# Patient Record
Sex: Female | Born: 1980 | Race: Black or African American | Hispanic: No | Marital: Single | Smoking: Never smoker
Health system: Southern US, Community
[De-identification: ages and names within clinical notes are randomized; demographics above are authoritative.]

## PROBLEM LIST (undated history)

## (undated) DIAGNOSIS — D219 Benign neoplasm of connective and other soft tissue, unspecified: Secondary | ICD-10-CM

## (undated) DIAGNOSIS — L509 Urticaria, unspecified: Secondary | ICD-10-CM

## (undated) DIAGNOSIS — D649 Anemia, unspecified: Secondary | ICD-10-CM

## (undated) HISTORY — PX: ROBOT ASSISTED MYOMECTOMY: SHX5142

## (undated) HISTORY — DX: Urticaria, unspecified: L50.9

---

## 2005-02-24 ENCOUNTER — Ambulatory Visit: Payer: Self-pay | Admitting: Family Medicine

## 2005-02-24 ENCOUNTER — Encounter: Payer: Self-pay | Admitting: Family Medicine

## 2005-05-21 ENCOUNTER — Emergency Department (HOSPITAL_COMMUNITY): Admission: EM | Admit: 2005-05-21 | Discharge: 2005-05-21 | Payer: Self-pay | Admitting: Family Medicine

## 2006-03-07 ENCOUNTER — Ambulatory Visit: Payer: Self-pay | Admitting: *Deleted

## 2006-03-07 ENCOUNTER — Encounter (INDEPENDENT_AMBULATORY_CARE_PROVIDER_SITE_OTHER): Payer: Self-pay | Admitting: *Deleted

## 2006-03-21 ENCOUNTER — Ambulatory Visit: Payer: Self-pay | Admitting: Obstetrics & Gynecology

## 2007-07-04 HISTORY — PX: ROBOT ASSISTED MYOMECTOMY: SHX5142

## 2007-12-12 ENCOUNTER — Emergency Department (HOSPITAL_BASED_OUTPATIENT_CLINIC_OR_DEPARTMENT_OTHER): Admission: EM | Admit: 2007-12-12 | Discharge: 2007-12-12 | Payer: Self-pay | Admitting: Emergency Medicine

## 2007-12-13 ENCOUNTER — Emergency Department (HOSPITAL_BASED_OUTPATIENT_CLINIC_OR_DEPARTMENT_OTHER): Admission: EM | Admit: 2007-12-13 | Discharge: 2007-12-13 | Payer: Self-pay | Admitting: Emergency Medicine

## 2007-12-23 ENCOUNTER — Other Ambulatory Visit: Admission: RE | Admit: 2007-12-23 | Discharge: 2007-12-23 | Payer: Self-pay | Admitting: Family Medicine

## 2007-12-26 ENCOUNTER — Encounter: Admission: RE | Admit: 2007-12-26 | Discharge: 2007-12-26 | Payer: Self-pay | Admitting: Family Medicine

## 2009-01-05 ENCOUNTER — Other Ambulatory Visit: Admission: RE | Admit: 2009-01-05 | Discharge: 2009-01-05 | Payer: Self-pay | Admitting: Obstetrics and Gynecology

## 2010-01-10 ENCOUNTER — Other Ambulatory Visit: Admission: RE | Admit: 2010-01-10 | Discharge: 2010-01-10 | Payer: Self-pay | Admitting: Obstetrics and Gynecology

## 2010-11-18 NOTE — Group Therapy Note (Signed)
[name], [name]               ACCOUNT NO.:  [id number]   MEDICAL RECORD NO.:  [id number]          PATIENT TYPE:  WOC   LOCATION:  [facility]                   FACILITY:  [facility]   PHYSICIAN:  Tinnie Gens, MD        DATE OF BIRTH:  [birth year]   DATE OF SERVICE:  02/24/2005                                    CLINIC NOTE   CHIEF COMPLAINT:  Pap smear.   HISTORY OF PRESENT ILLNESS:  The patient is a 30 year old G0 who comes in  today for annual exam. She currently uses condoms for birth control. She has  regular cycles that approximately 7 days with heavy flow, moderate to severe  pain related to her pounds. LMP is February 12, 2005. She has never been on  any birth control.   PAST MEDICAL HISTORY:  Negative.   PAST SURGICAL HISTORY:  Negative.   MEDICATIONS:  None.   ALLERGIES:  None known.   OBSTETRICAL HISTORY:  G0.   GYNECOLOGICAL HISTORY:  History of abnormal Pap in February 2004. She had a  repeat Pap in August 2004 and no further follow-up since that time.   FAMILY HISTORY:  High blood pressure.   SOCIAL HISTORY:  The patient works at [employer]. She does use  tobacco, alcohol or drugs.   REVIEW OF SYSTEMS:  A 14-point review of systems reviewed, please see GYN  history in the chart. She does have headaches as well as vaginal odor that  she was having over some months last year after her cycle was complete. She  denies douching or sit-down baths with an frequent or regularity.   PHYSICAL EXAMINATION:  VITAL SIGNS:  As noted in the chart.  GENERAL:  She is a well-developed, well-nourished black female in no acute  distress.  BREASTS:  Symmetric with everted nipples.  NECK:  Supple with normal thyroid.  ABDOMEN:  Soft, nontender, nondistended.  GENITOURINARY:  Normal external female genitalia. The vagina is pink and  rugated. The C-section is nulliparous and without lesion. The uterus is less  than 6 weeks size, anteverted, nontender. There is no adnexal mass  or  tenderness.  EXTREMITIES:  No cyanosis, clubbing, or edema.   IMPRESSION:  1.  Gynecologic examination with Pap and pelvic.  2.  Contraceptive counseling.   PLAN:  1.  Pap smear today.  2.  GC and chlamydia check as the patient is less than 25.  3.  Fifteen minutes were spent with this patient discussing different forms      of birth control including the patch, the ring, the pill, Depo-Provera,      IUD. The patient agrees to a trial of low-dose oral contraceptives.      However, it is not clear from our discussion whether she actually will      start this or not. The pros and cons of this method were discussed      including predictability of cycle, decreased length of flow, and      decreased pain with periods.  4.  Discussion was also held with the patient regarding good vaginal  health  and the patient seemed to understand all this and will return with      further symptoms.           ______________________________  Tinnie Gens, MD     TP/MEDQ  D:  02/24/2005  T:  02/24/2005  Job:  [id number]

## 2010-11-18 NOTE — Group Therapy Note (Signed)
NAMEJAMIRAH, [name]               ACCOUNT NO.:  [id number]   MEDICAL RECORD NO.:  [id number]          PATIENT TYPE:  WOC   LOCATION:  [facility]                   FACILITY:  [facility]   PHYSICIAN:  Carolanne Grumbling, M.D.   DATE OF BIRTH:  [birth year]   DATE OF SERVICE:                                    CLINIC NOTE   CHIEF COMPLAINT:  Pap smear.   HISTORY OF PRESENT ILLNESS:  A 30 year old G0 here for annual exam.  The  patient seen last year for same thing.  Prescribed birth control pills and  reports that she lost her hair from them.  Her hair started growing back  after she stopped the pills.  The patient is still sexually active and is  not consistently using condoms.  She does not desire pregnancy at this  point.   PAST MEDICAL HISTORY:  All reviewed and no changes from office visit on  February 24, 2005.   PAST SURGICAL HISTORY:  All reviewed and no changes from office visit on  February 24, 2005.   MEDICATIONS:  All reviewed and no changes from office visit on February 24, 2005.   ALLERGIES:   OBSTETRICAL HISTORY:  All reviewed and no changes from office visit on  February 24, 2005.   GYNECOLOGICAL HISTORY:  All reviewed and no changes from office visit on  February 24, 2005.   FAMILY HISTORY:  All reviewed and no changes from office visit on February 24, 2005.   SOCIAL HISTORY:  All reviewed and no changes from office visit on February 24, 2005.   PHYSICAL EXAMINATION:  VITAL SIGNS:  Temperature 99.1, blood pressure  107/60, pulse 69, weight 131.8 pounds, height 5 feet 5 inches.  GENERAL:  Well-developed, well-nourished, African American female in no  apparent distress.  HEENT:  Normocephalic, atraumatic.  NECK:  Supple, no thyromegaly.  CARDIOVASCULAR:  Regular rate and rhythm.  No heaves.  LUNGS:  Clear to auscultation bilaterally.  BREASTS:  Symmetric with no masses.  ABDOMEN:  Soft and nontender.  GENITOURINARY:  Normal external female genitalia with pink rugae.  Cervix  is  nulliparous without lesions.  Uterus is anteverted and nontender.  No  adnexal masses.  EXTREMITIES:  No cyanosis, edema.   IMPRESSION:  1. Well woman exam with Papanicolaou smear and pelvic.  2. __________.  3. Contraceptive counseling.   PLAN:  1. Pap smear today.  2. Gonorrhea, chlamydia, wet prep, RPR, and HIV all done today.  3. Time was spent counseling the patient on different methods of birth      control including IUD and family planning. She is to obtain information      about IUD and is considering that.  She is also being counseled on how      to find out if there is a natural family planning counselor available.      I stressed the importance that she use some method otherwise pregnancy      is a definite possibility.  I counseled her to take a multivitamin with      folic acid daily.  4. The  patient is to return in two weeks to discuss results of HIV test.      I explained that I do not give these results over the phone.           ______________________________  Carolanne Grumbling, M.D.     TW/MEDQ  D:  03/07/2006  T:  03/08/2006  Job:  [id number]

## 2011-01-12 ENCOUNTER — Other Ambulatory Visit (HOSPITAL_COMMUNITY)
Admission: RE | Admit: 2011-01-12 | Discharge: 2011-01-12 | Disposition: A | Discharge status: Home / Self Care | Payer: 59 | Source: Ambulatory Visit | Attending: Obstetrics and Gynecology | Admitting: Obstetrics and Gynecology

## 2011-01-12 ENCOUNTER — Other Ambulatory Visit: Payer: Self-pay | Admitting: Obstetrics and Gynecology

## 2011-01-12 DIAGNOSIS — Z01419 Encounter for gynecological examination (general) (routine) without abnormal findings: Secondary | ICD-10-CM | POA: Insufficient documentation

## 2011-01-12 DIAGNOSIS — Z113 Encounter for screening for infections with a predominantly sexual mode of transmission: Secondary | ICD-10-CM | POA: Insufficient documentation

## 2011-02-01 ENCOUNTER — Other Ambulatory Visit: Payer: Self-pay | Admitting: Family Medicine

## 2011-02-01 DIAGNOSIS — E041 Nontoxic single thyroid nodule: Secondary | ICD-10-CM

## 2011-02-03 ENCOUNTER — Ambulatory Visit
Admission: RE | Admit: 2011-02-03 | Discharge: 2011-02-03 | Disposition: A | Discharge status: Home / Self Care | Payer: 59 | Source: Ambulatory Visit | Attending: Family Medicine | Admitting: Family Medicine

## 2011-02-03 DIAGNOSIS — E041 Nontoxic single thyroid nodule: Secondary | ICD-10-CM

## 2012-01-26 ENCOUNTER — Other Ambulatory Visit: Payer: Self-pay | Admitting: Family Medicine

## 2012-01-26 DIAGNOSIS — E041 Nontoxic single thyroid nodule: Secondary | ICD-10-CM

## 2012-01-31 ENCOUNTER — Ambulatory Visit
Admission: RE | Admit: 2012-01-31 | Discharge: 2012-01-31 | Disposition: A | Discharge status: Home / Self Care | Payer: 59 | Source: Ambulatory Visit | Attending: Family Medicine | Admitting: Family Medicine

## 2012-01-31 DIAGNOSIS — E041 Nontoxic single thyroid nodule: Secondary | ICD-10-CM

## 2012-02-06 ENCOUNTER — Other Ambulatory Visit: Payer: Self-pay | Admitting: Obstetrics and Gynecology

## 2012-02-06 ENCOUNTER — Other Ambulatory Visit (HOSPITAL_COMMUNITY)
Admission: RE | Admit: 2012-02-06 | Discharge: 2012-02-06 | Disposition: A | Discharge status: Home / Self Care | Payer: 59 | Source: Ambulatory Visit | Attending: Obstetrics and Gynecology | Admitting: Obstetrics and Gynecology

## 2012-02-06 DIAGNOSIS — Z01419 Encounter for gynecological examination (general) (routine) without abnormal findings: Secondary | ICD-10-CM | POA: Insufficient documentation

## 2012-02-06 DIAGNOSIS — R87810 Cervical high risk human papillomavirus (HPV) DNA test positive: Secondary | ICD-10-CM | POA: Insufficient documentation

## 2012-02-06 DIAGNOSIS — Z113 Encounter for screening for infections with a predominantly sexual mode of transmission: Secondary | ICD-10-CM | POA: Insufficient documentation

## 2013-02-19 ENCOUNTER — Other Ambulatory Visit: Payer: Self-pay | Admitting: Obstetrics and Gynecology

## 2013-02-19 ENCOUNTER — Other Ambulatory Visit (HOSPITAL_COMMUNITY)
Admission: RE | Admit: 2013-02-19 | Discharge: 2013-02-19 | Disposition: A | Discharge status: Home / Self Care | Payer: 59 | Source: Ambulatory Visit | Attending: Obstetrics and Gynecology | Admitting: Obstetrics and Gynecology

## 2013-02-19 DIAGNOSIS — Z01419 Encounter for gynecological examination (general) (routine) without abnormal findings: Secondary | ICD-10-CM | POA: Insufficient documentation

## 2014-02-06 ENCOUNTER — Encounter (HOSPITAL_COMMUNITY): Payer: Self-pay | Admitting: Pharmacist

## 2014-02-17 ENCOUNTER — Encounter (HOSPITAL_COMMUNITY): Payer: Self-pay

## 2014-02-17 ENCOUNTER — Encounter (HOSPITAL_COMMUNITY)
Admission: RE | Admit: 2014-02-17 | Discharge: 2014-02-17 | Disposition: A | Discharge status: Home / Self Care | Payer: 59 | Source: Ambulatory Visit | Attending: Obstetrics and Gynecology | Admitting: Obstetrics and Gynecology

## 2014-02-17 DIAGNOSIS — N938 Other specified abnormal uterine and vaginal bleeding: Secondary | ICD-10-CM | POA: Diagnosis present

## 2014-02-17 DIAGNOSIS — N83209 Unspecified ovarian cyst, unspecified side: Secondary | ICD-10-CM | POA: Diagnosis not present

## 2014-02-17 DIAGNOSIS — N925 Other specified irregular menstruation: Secondary | ICD-10-CM | POA: Diagnosis not present

## 2014-02-17 DIAGNOSIS — D250 Submucous leiomyoma of uterus: Secondary | ICD-10-CM | POA: Diagnosis not present

## 2014-02-17 DIAGNOSIS — N949 Unspecified condition associated with female genital organs and menstrual cycle: Secondary | ICD-10-CM | POA: Diagnosis present

## 2014-02-17 DIAGNOSIS — N803 Endometriosis of pelvic peritoneum, unspecified: Secondary | ICD-10-CM | POA: Diagnosis not present

## 2014-02-17 DIAGNOSIS — D649 Anemia, unspecified: Secondary | ICD-10-CM | POA: Diagnosis not present

## 2014-02-17 DIAGNOSIS — N920 Excessive and frequent menstruation with regular cycle: Secondary | ICD-10-CM | POA: Diagnosis not present

## 2014-02-17 DIAGNOSIS — N80109 Endometriosis of ovary, unspecified side, unspecified depth: Secondary | ICD-10-CM | POA: Diagnosis not present

## 2014-02-17 DIAGNOSIS — N801 Endometriosis of ovary: Secondary | ICD-10-CM | POA: Diagnosis not present

## 2014-02-17 HISTORY — DX: Anemia, unspecified: D64.9

## 2014-02-17 LAB — CBC
HCT: 39.7 % (ref 36.0–46.0)
Hemoglobin: 13.3 g/dL (ref 12.0–15.0)
MCH: 29 pg (ref 26.0–34.0)
MCHC: 33.5 g/dL (ref 30.0–36.0)
MCV: 86.7 fL (ref 78.0–100.0)
Platelets: 305 10*3/uL (ref 150–400)
RBC: 4.58 MIL/uL (ref 3.87–5.11)
RDW: 19.2 % — ABNORMAL HIGH (ref 11.5–15.5)
WBC: 5.2 10*3/uL (ref 4.0–10.5)

## 2014-02-17 NOTE — Patient Instructions (Signed)
Your procedure is scheduled on:02/20/14  Enter through the Main Entrance at : 9am Pick up desk phone and dial [phone] and inform us of your arrival.  Please call [phone] if you have any problems the morning of surgery.  Remember: Do not eat food or drink liquids, including water, after midnight:Thursday    You may brush your teeth the morning of surgery.  Take these meds the morning of surgery with a sip of water:  DO NOT wear jewelry, eye make-up, lipstick,body lotion, or dark fingernail polish.  (Polished toes are ok) You may wear deodorant.  If you are to be admitted after surgery, leave suitcase in car until your room has been assigned. Patients discharged on the day of surgery will not be allowed to drive home. Wear loose fitting, comfortable clothes for your ride home.

## 2014-02-20 ENCOUNTER — Encounter (HOSPITAL_COMMUNITY): Payer: 59 | Admitting: Anesthesiology

## 2014-02-20 ENCOUNTER — Encounter (HOSPITAL_COMMUNITY)
Admission: RE | Disposition: A | Discharge status: Home / Self Care | Payer: Self-pay | Source: Ambulatory Visit | Attending: Obstetrics and Gynecology

## 2014-02-20 ENCOUNTER — Ambulatory Visit (HOSPITAL_COMMUNITY)
Admission: RE | Admit: 2014-02-20 | Discharge: 2014-02-20 | Disposition: A | Discharge status: Home / Self Care | Payer: 59 | Source: Ambulatory Visit | Attending: Obstetrics and Gynecology | Admitting: Obstetrics and Gynecology

## 2014-02-20 ENCOUNTER — Encounter (HOSPITAL_COMMUNITY): Payer: Self-pay

## 2014-02-20 ENCOUNTER — Ambulatory Visit (HOSPITAL_COMMUNITY): Payer: 59 | Admitting: Anesthesiology

## 2014-02-20 DIAGNOSIS — N801 Endometriosis of ovary: Secondary | ICD-10-CM | POA: Insufficient documentation

## 2014-02-20 DIAGNOSIS — N949 Unspecified condition associated with female genital organs and menstrual cycle: Secondary | ICD-10-CM | POA: Diagnosis not present

## 2014-02-20 DIAGNOSIS — N803 Endometriosis of pelvic peritoneum, unspecified: Secondary | ICD-10-CM | POA: Insufficient documentation

## 2014-02-20 DIAGNOSIS — N920 Excessive and frequent menstruation with regular cycle: Secondary | ICD-10-CM | POA: Insufficient documentation

## 2014-02-20 DIAGNOSIS — N938 Other specified abnormal uterine and vaginal bleeding: Secondary | ICD-10-CM | POA: Diagnosis not present

## 2014-02-20 DIAGNOSIS — N925 Other specified irregular menstruation: Secondary | ICD-10-CM | POA: Insufficient documentation

## 2014-02-20 DIAGNOSIS — N80109 Endometriosis of ovary, unspecified side, unspecified depth: Secondary | ICD-10-CM | POA: Insufficient documentation

## 2014-02-20 DIAGNOSIS — D250 Submucous leiomyoma of uterus: Secondary | ICD-10-CM | POA: Insufficient documentation

## 2014-02-20 DIAGNOSIS — N83209 Unspecified ovarian cyst, unspecified side: Secondary | ICD-10-CM | POA: Insufficient documentation

## 2014-02-20 DIAGNOSIS — D649 Anemia, unspecified: Secondary | ICD-10-CM | POA: Insufficient documentation

## 2014-02-20 HISTORY — PX: ROBOTIC ASSISTED LAPAROSCOPIC LYSIS OF ADHESION: SHX6080

## 2014-02-20 HISTORY — PX: ROBOT ASSISTED MYOMECTOMY: SHX5142

## 2014-02-20 HISTORY — PX: ROBOTIC ASSISTED LAPAROSCOPIC OVARIAN CYSTECTOMY: SHX6081

## 2014-02-20 LAB — TYPE AND SCREEN
ABO/RH(D): O POS
Antibody Screen: NEGATIVE

## 2014-02-20 LAB — PREGNANCY, URINE: Preg Test, Ur: NEGATIVE

## 2014-02-20 LAB — ABO/RH: ABO/RH(D): O POS

## 2014-02-20 SURGERY — ROBOTIC ASSISTED MYOMECTOMY
Anesthesia: General | Site: Abdomen | Laterality: Right

## 2014-02-20 MED ORDER — DEXAMETHASONE SODIUM PHOSPHATE 10 MG/ML IJ SOLN
INTRAMUSCULAR | Status: DC | PRN
  Administered 2014-02-20: 4 mg via INTRAVENOUS

## 2014-02-20 MED ORDER — PROPOFOL 10 MG/ML IV EMUL
INTRAVENOUS | Status: AC
  Filled 2014-02-20: qty 20

## 2014-02-20 MED ORDER — ACETAMINOPHEN 160 MG/5ML PO SOLN
ORAL | Status: AC
  Filled 2014-02-20: qty 40.6

## 2014-02-20 MED ORDER — CEFAZOLIN SODIUM-DEXTROSE 2-3 GM-% IV SOLR
2.0000 g | INTRAVENOUS | Status: AC
  Administered 2014-02-20: 2 g via INTRAVENOUS

## 2014-02-20 MED ORDER — PHENYLEPHRINE HCL 10 MG/ML IJ SOLN
INTRAMUSCULAR | Status: DC | PRN
  Administered 2014-02-20: 80 ug via INTRAVENOUS

## 2014-02-20 MED ORDER — VASOPRESSIN 20 UNIT/ML IJ SOLN
INTRAMUSCULAR | Status: AC
  Filled 2014-02-20: qty 1

## 2014-02-20 MED ORDER — KETOROLAC TROMETHAMINE 30 MG/ML IJ SOLN
INTRAMUSCULAR | Status: DC | PRN
  Administered 2014-02-20: 30 mg via INTRAVENOUS

## 2014-02-20 MED ORDER — HEPARIN SODIUM (PORCINE) 5000 UNIT/ML IJ SOLN
INTRAMUSCULAR | Status: AC
  Filled 2014-02-20: qty 2

## 2014-02-20 MED ORDER — ONDANSETRON HCL 4 MG/2ML IJ SOLN
INTRAMUSCULAR | Status: DC | PRN
  Administered 2014-02-20: 4 mg via INTRAVENOUS

## 2014-02-20 MED ORDER — MIDAZOLAM HCL 2 MG/2ML IJ SOLN
INTRAMUSCULAR | Status: AC
  Filled 2014-02-20: qty 2

## 2014-02-20 MED ORDER — HEPARIN SODIUM (PORCINE) 5000 UNIT/ML IJ SOLN
INTRAMUSCULAR | Status: DC | PRN
  Administered 2014-02-20: 5000 [IU]
  Administered 2014-02-20: 15000 [IU]

## 2014-02-20 MED ORDER — INDIGOTINDISULFONATE SODIUM 8 MG/ML IJ SOLN: INTRAMUSCULAR | Status: DC | PRN

## 2014-02-20 MED ORDER — LIDOCAINE HCL (CARDIAC) 20 MG/ML IV SOLN
INTRAVENOUS | Status: AC
  Filled 2014-02-20: qty 5

## 2014-02-20 MED ORDER — GLYCOPYRROLATE 0.2 MG/ML IJ SOLN
INTRAMUSCULAR | Status: DC | PRN
  Administered 2014-02-20: 0.4 mg via INTRAVENOUS

## 2014-02-20 MED ORDER — METHYLENE BLUE 1 % INJ SOLN
INTRAMUSCULAR | Status: DC | PRN
  Administered 2014-02-20: 50 mL

## 2014-02-20 MED ORDER — ONDANSETRON HCL 4 MG PO TABS: 4.0000 mg | ORAL_TABLET | Freq: Three times a day (TID) | ORAL | Status: DC | PRN

## 2014-02-20 MED ORDER — FENTANYL CITRATE 0.05 MG/ML IJ SOLN
INTRAMUSCULAR | Status: DC | PRN
Start: 2014-02-20 — End: 2014-02-20
  Administered 2014-02-20: 100 ug via INTRAVENOUS
  Administered 2014-02-20: 50 ug via INTRAVENOUS
  Administered 2014-02-20 (×2): 100 ug via INTRAVENOUS

## 2014-02-20 MED ORDER — ONDANSETRON HCL 4 MG/2ML IJ SOLN
INTRAMUSCULAR | Status: AC
  Filled 2014-02-20: qty 2

## 2014-02-20 MED ORDER — DEXAMETHASONE SODIUM PHOSPHATE 10 MG/ML IJ SOLN
INTRAMUSCULAR | Status: AC
  Filled 2014-02-20: qty 1

## 2014-02-20 MED ORDER — SCOPOLAMINE 1 MG/3DAYS TD PT72
MEDICATED_PATCH | TRANSDERMAL | Status: AC
  Administered 2014-02-20: 1.5 mg via TRANSDERMAL
  Filled 2014-02-20: qty 1

## 2014-02-20 MED ORDER — HEPARIN SODIUM (PORCINE) 5000 UNIT/ML IJ SOLN
INTRAMUSCULAR | Status: AC
  Filled 2014-02-20: qty 1

## 2014-02-20 MED ORDER — FENTANYL CITRATE 0.05 MG/ML IJ SOLN
INTRAMUSCULAR | Status: AC
  Filled 2014-02-20: qty 5

## 2014-02-20 MED ORDER — GLYCOPYRROLATE 0.2 MG/ML IJ SOLN
INTRAMUSCULAR | Status: AC
  Filled 2014-02-20: qty 2

## 2014-02-20 MED ORDER — LIDOCAINE HCL (CARDIAC) 20 MG/ML IV SOLN
INTRAVENOUS | Status: DC | PRN
  Administered 2014-02-20: 50 mg via INTRAVENOUS

## 2014-02-20 MED ORDER — CEFAZOLIN SODIUM-DEXTROSE 2-3 GM-% IV SOLR
INTRAVENOUS | Status: DC
Start: 2014-02-20 — End: 2014-02-20
  Filled 2014-02-20: qty 50

## 2014-02-20 MED ORDER — MIDAZOLAM HCL 2 MG/2ML IJ SOLN
INTRAMUSCULAR | Status: DC | PRN
  Administered 2014-02-20: 2 mg via INTRAVENOUS

## 2014-02-20 MED ORDER — PROPOFOL 10 MG/ML IV BOLUS
INTRAVENOUS | Status: DC | PRN
  Administered 2014-02-20: 160 mg via INTRAVENOUS

## 2014-02-20 MED ORDER — FENTANYL CITRATE 0.05 MG/ML IJ SOLN
25.0000 ug | INTRAMUSCULAR | Status: DC | PRN
  Administered 2014-02-20: 25 ug via INTRAVENOUS

## 2014-02-20 MED ORDER — SODIUM CHLORIDE 0.9 % IV SOLN
INTRAVENOUS | Status: DC | PRN
  Administered 2014-02-20 (×2): via INTRAMUSCULAR

## 2014-02-20 MED ORDER — ACETAMINOPHEN 160 MG/5ML PO SOLN
960.0000 mg | Freq: Four times a day (QID) | ORAL | Status: DC | PRN
  Administered 2014-02-20: 960 mg via ORAL

## 2014-02-20 MED ORDER — BUPIVACAINE-EPINEPHRINE (PF) 0.25% -1:200000 IJ SOLN
INTRAMUSCULAR | Status: AC
  Filled 2014-02-20: qty 30

## 2014-02-20 MED ORDER — LACTATED RINGERS IV SOLN
INTRAVENOUS | Status: DC
  Administered 2014-02-20 (×2): via INTRAVENOUS
  Administered 2014-02-20: 100 mL/h via INTRAVENOUS

## 2014-02-20 MED ORDER — FENTANYL CITRATE 0.05 MG/ML IJ SOLN
INTRAMUSCULAR | Status: AC
  Filled 2014-02-20: qty 2

## 2014-02-20 MED ORDER — SCOPOLAMINE 1 MG/3DAYS TD PT72
1.0000 | MEDICATED_PATCH | Freq: Once | TRANSDERMAL | Status: DC
  Administered 2014-02-20: 1.5 mg via TRANSDERMAL

## 2014-02-20 MED ORDER — METHYLENE BLUE 1 % INJ SOLN
INTRAMUSCULAR | Status: AC
  Filled 2014-02-20: qty 1

## 2014-02-20 MED ORDER — CEFAZOLIN SODIUM-DEXTROSE 2-3 GM-% IV SOLR
INTRAVENOUS | Status: AC
  Filled 2014-02-20: qty 50

## 2014-02-20 MED ORDER — NEOSTIGMINE METHYLSULFATE 10 MG/10ML IV SOLN
INTRAVENOUS | Status: DC | PRN
  Administered 2014-02-20: 3 mg via INTRAVENOUS

## 2014-02-20 MED ORDER — LACTATED RINGERS IR SOLN
Status: DC | PRN
  Administered 2014-02-20: 3000 mL

## 2014-02-20 MED ORDER — BUPIVACAINE-EPINEPHRINE 0.25% -1:200000 IJ SOLN
INTRAMUSCULAR | Status: DC | PRN
  Administered 2014-02-20: 12 mL

## 2014-02-20 MED ORDER — OXYCODONE-ACETAMINOPHEN 7.5-325 MG PO TABS: 1.0000 | ORAL_TABLET | ORAL | Status: DC | PRN

## 2014-02-20 MED ORDER — ROCURONIUM BROMIDE 100 MG/10ML IV SOLN
INTRAVENOUS | Status: AC
  Filled 2014-02-20: qty 1

## 2014-02-20 MED ORDER — ROCURONIUM BROMIDE 100 MG/10ML IV SOLN
INTRAVENOUS | Status: DC | PRN
  Administered 2014-02-20: 20 mg via INTRAVENOUS
  Administered 2014-02-20: 50 mg via INTRAVENOUS
  Administered 2014-02-20: 10 mg via INTRAVENOUS

## 2014-02-20 MED ORDER — FENTANYL CITRATE 0.05 MG/ML IJ SOLN
INTRAMUSCULAR | Status: AC
  Administered 2014-02-20: 25 ug via INTRAVENOUS
  Filled 2014-02-20: qty 2

## 2014-02-20 MED ORDER — SODIUM CHLORIDE 0.9 % IJ SOLN
INTRAMUSCULAR | Status: AC
  Filled 2014-02-20: qty 50

## 2014-02-20 SURGICAL SUPPLY — 63 items
ADAPTER CATH SYR TO TUBING 38M (ADAPTER) IMPLANT
ADH SKN CLS APL DERMABOND .7 (GAUZE/BANDAGES/DRESSINGS) ×2
ADPR CATH LL SYR 3/32 TPR (ADAPTER)
BARRIER ADHS 3X4 INTERCEED (GAUZE/BANDAGES/DRESSINGS) IMPLANT
BRR ADH 4X3 ABS CNTRL BYND (GAUZE/BANDAGES/DRESSINGS)
BRR ADH 6X5 SEPRAFILM 1 SHT (MISCELLANEOUS) ×4
CATH ROBINSON RED A/P 16FR (CATHETERS) ×1 IMPLANT
CHLORAPREP W/TINT 26ML (MISCELLANEOUS) ×3 IMPLANT
CLOTH BEACON ORANGE TIMEOUT ST (SAFETY) ×3 IMPLANT
CONT PATH 16OZ SNAP LID 3702 (MISCELLANEOUS) ×3 IMPLANT
COVER TABLE BACK 60X90 (DRAPES) ×6 IMPLANT
COVER TIP SHEARS 8 DVNC (MISCELLANEOUS) ×2 IMPLANT
COVER TIP SHEARS 8MM DA VINCI (MISCELLANEOUS) ×1
DECANTER SPIKE VIAL GLASS SM (MISCELLANEOUS) ×6 IMPLANT
DERMABOND ADVANCED (GAUZE/BANDAGES/DRESSINGS) ×1
DERMABOND ADVANCED .7 DNX12 (GAUZE/BANDAGES/DRESSINGS) ×2 IMPLANT
DEVICE TROCAR PUNCTURE CLOSURE (ENDOMECHANICALS) IMPLANT
DRAPE HUG U DISPOSABLE (DRAPE) ×3 IMPLANT
DRAPE WARM FLUID 44X44 (DRAPE) ×3 IMPLANT
DRSG COVADERM PLUS 2X2 (GAUZE/BANDAGES/DRESSINGS) ×11 IMPLANT
DRSG OPSITE POSTOP 3X4 (GAUZE/BANDAGES/DRESSINGS) ×3 IMPLANT
ELECT REM PT RETURN 9FT ADLT (ELECTROSURGICAL) ×3
ELECTRODE REM PT RTRN 9FT ADLT (ELECTROSURGICAL) ×2 IMPLANT
EVACUATOR SMOKE 8.L (FILTER) ×3 IMPLANT
FILTER STRAW FLUID ASPIR (MISCELLANEOUS) IMPLANT
GAUZE VASELINE 3X9 (GAUZE/BANDAGES/DRESSINGS) IMPLANT
GLOVE BIO SURGEON STRL SZ8 (GLOVE) ×9 IMPLANT
GLOVE BIOGEL PI IND STRL 8.5 (GLOVE) ×2 IMPLANT
GLOVE BIOGEL PI INDICATOR 8.5 (GLOVE) ×1
GOWN STRL REUS W/TWL LRG LVL3 (GOWN DISPOSABLE) ×24 IMPLANT
KIT ABG SYR 3ML LUER SLIP (SYRINGE) ×3 IMPLANT
KIT ACCESSORY DA VINCI DISP (KITS) ×1
KIT ACCESSORY DVNC DISP (KITS) ×2 IMPLANT
LEGGING LITHOTOMY PAIR STRL (DRAPES) ×3 IMPLANT
MANIPULATOR UTERINE 4.5 ZUMI (MISCELLANEOUS) ×3 IMPLANT
NDL SPNL 22GX7 QUINCKE BK (NEEDLE) IMPLANT
NEEDLE INSUFFLATION 120MM (ENDOMECHANICALS) ×3 IMPLANT
NEEDLE SPNL 22GX7 QUINCKE BK (NEEDLE) ×3 IMPLANT
NS IRRIG 1000ML POUR BTL (IV SOLUTION) ×6 IMPLANT
PACK ROBOT WH (CUSTOM PROCEDURE TRAY) ×3 IMPLANT
SEPRAFILM MEMBRANE 5X6 (MISCELLANEOUS) ×2 IMPLANT
SET CYSTO W/LG BORE CLAMP LF (SET/KITS/TRAYS/PACK) IMPLANT
SET IRRIG TUBING LAPAROSCOPIC (IRRIGATION / IRRIGATOR) ×3 IMPLANT
SUT MNCRL AB 4-0 PS2 18 (SUTURE) ×2 IMPLANT
SUT PDS AB 4-0 SH 27 (SUTURE) ×2 IMPLANT
SUT VICRYL 0 UR6 27IN ABS (SUTURE) ×1 IMPLANT
SUT VLOC 180 2-0 6IN GS21 (SUTURE) ×2 IMPLANT
SUT VLOC 180 2-0 9IN GS21 (SUTURE) ×1 IMPLANT
SUT VLOC 180 3-0 9IN GS21 (SUTURE) IMPLANT
SYR TOOMEY 50ML (SYRINGE) ×3 IMPLANT
SYRINGE 60CC LL (MISCELLANEOUS) ×3 IMPLANT
SYS LAPSCP GELPORT 120MM (MISCELLANEOUS) ×3
SYSTEM CONVERTIBLE TROCAR (TROCAR) IMPLANT
SYSTEM LAPSCP GELPORT 120MM (MISCELLANEOUS) IMPLANT
TOWEL OR 17X24 6PK STRL BLUE (TOWEL DISPOSABLE) ×9 IMPLANT
TRAY FOLEY BAG SILVER LF 14FR (CATHETERS) ×3 IMPLANT
TROCAR 12M 150ML BLUNT (TROCAR) IMPLANT
TROCAR DILATING TIP 12MM 150MM (ENDOMECHANICALS) ×3 IMPLANT
TROCAR DISP BLADELESS 8 DVNC (TROCAR) ×2 IMPLANT
TROCAR DISP BLADELESS 8MM (TROCAR) ×1
TROCAR XCEL 12X100 BLDLESS (ENDOMECHANICALS) ×1 IMPLANT
WARMER LAPAROSCOPE (MISCELLANEOUS) ×3 IMPLANT
WATER STERILE IRR 1000ML POUR (IV SOLUTION) ×3 IMPLANT

## 2014-02-20 NOTE — Discharge Instructions (Signed)

## 2014-02-20 NOTE — H&P (Signed)
[name] is a 33 y.o. female , originally referred to me by Dr. Simona Huh for robotic assisted myomectomy.  She was diagnosed with recurrent fibroids because of abnormal uterine bleeding and was placed on iron pills.  She has been having monthly periods but with heavy flow and prolonged duration. More recently I have been treating her with Femara + Danazol 200 mg bid x 5 wk.  Patient would like to preserve her childbearing potential. She had a myomectomy by me in 2009. She was recently found to have a midline unilocular cystic area 4 cm behind umbilicus, probably originating from the right ovary.  Pertinent Gynecological History: Menses: flow is excessive with use of 3 pads or tampons on heaviest days Bleeding: dysfunctional uterine bleeding Contraception: none DES exposure: denies Blood transfusions: none Sexually transmitted diseases: no past history Previous GYN Procedures: L/S RAM in 2009  Last pap: normal  OB History: nulligravid   Menstrual History: Menarche age: 14 No LMP recorded.    Past Medical History  Diagnosis Date  . Anemia                     Past Surgical History  Procedure Laterality Date  . Robot assisted myomectomy               No family history on file. No hereditary disease.  No cancer of breast, ovary, uterus. No cutaneous leiomyomatosis or renal cell carcinoma.  History   Social History  . Marital Status: Single    Spouse Name: N/A    Number of Children: N/A  . Years of Education: N/A   Occupational History  . Not on file.   Social History Main Topics  . Smoking status: Never Smoker   . Smokeless tobacco: Not on file  . Alcohol Use: Not on file  . Drug Use: Not on file  . Sexual Activity: No   Other Topics Concern  . Not on file   Social History Narrative  . No narrative on file    No Known Allergies  No current facility-administered medications on file prior to encounter.   No current outpatient prescriptions on file  prior to encounter.     Review of Systems  Constitutional: Negative.   HENT: Negative.   Eyes: Negative.   Respiratory: Negative.   Cardiovascular: Negative.   Gastrointestinal: Negative.   Genitourinary: Negative.   Musculoskeletal: Negative.   Skin: Negative.   Neurological: Negative.   Endo/Heme/Allergies: Negative.   Psychiatric/Behavioral: Negative.      Physical Exam  BP 126/71  Pulse 52  Temp(Src) 97.9 F (36.6 C) (Oral)  Resp 18  Ht 5\' 5"  (1.651 m)  Wt 76.658 kg (169 lb)  BMI 28.12 kg/m2  SpO2 100% Constitutional: She is oriented to person, place, and time. She appears well-developed and well-nourished.  HENT:  Head: Normocephalic and atraumatic.  Nose: Nose normal.  Mouth/Throat: Oropharynx is clear and moist. No oropharyngeal exudate.  Eyes: Conjunctivae normal and EOM are normal. Pupils are equal, round, and reactive to light. No scleral icterus.  Neck: Normal range of motion. Neck supple. No tracheal deviation present. No thyromegaly present.  Cardiovascular: Normal rate.   Respiratory: Effort normal and breath sounds normal.  GI: Soft. Bowel sounds are normal. She exhibits no distension and a 15 wk size pelvic mass. There is no tenderness.  Lymphadenopathy:    She has no cervical adenopathy.  Neurological: She is alert and oriented to person, place, and time. She has normal  reflexes.  Skin: Skin is warm.  Psychiatric: She has a normal mood and affect. Her behavior is normal. Judgment and thought content normal.    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 Simple cyst, probably rt ovarian Preoperative for robot assisted myomectomy Benefits and risks of robotic myomectomy, and ovarian cystectomy were discussed with the patient and her family member again.  Bowel prep instructions were given.  All of patient's questions were answered.  She verbalized understanding.  She knows that she will need a  cesarean delivery for future pregnancies, and that it is recommended she does not conceive for 2-3 months for uterus to heal.    Governor Specking

## 2014-02-20 NOTE — Anesthesia Procedure Notes (Signed)
Procedure Name: Intubation Date/Time: 02/20/2014 11:25 AM Performed by: Jonna Munro Pre-anesthesia Checklist: Patient being monitored, Suction available, Emergency Drugs available, Patient identified and Timeout performed Patient Re-evaluated:Patient Re-evaluated prior to inductionOxygen Delivery Method: Circle system utilized Preoxygenation: Pre-oxygenation with 100% oxygen Intubation Type: IV induction Ventilation: Mask ventilation without difficulty Laryngoscope Size: Mac and 3 Grade View: Grade I Tube type: Oral Tube size: 7.0 mm Number of attempts: 1 Airway Equipment and Method: Stylet Secured at: 20 cm Tube secured with: Tape Dental Injury: Teeth and Oropharynx as per pre-operative assessment

## 2014-02-20 NOTE — Progress Notes (Signed)
History and Physical Interval Note:  02/20/2014 11:13 AM  [name]  has presented today for surgery, with the diagnosis of Submucous leiomyoma of uterus  The various methods of treatment have been discussed with the patient and family. After consideration of risks, benefits and other options for treatment, the patient has consented to  Procedure(s): ROBOTIC ASSISTED MYOMECTOMY (N/A) as a surgical intervention .  The patient's history has been reviewed, patient examined, no change in status, stable for surgery.  I have reviewed the patient's chart and labs.  Questions were answered to the patient's satisfaction.     Governor Specking

## 2014-02-20 NOTE — Anesthesia Preprocedure Evaluation (Signed)

## 2014-02-20 NOTE — Anesthesia Postprocedure Evaluation (Signed)
  Anesthesia Post-op Note  Patient: [name]  Procedure(s) Performed: Procedure(s): ROBOTIC ASSISTED MYOMECTOMY (N/A) ROBOTIC ASSISTED LAPAROSCOPIC OVARIAN CYSTECTOMY (Right) ROBOTIC ASSISTED LAPAROSCOPIC LYSIS OF ADHESION,EXCISION OF ENDOMETRIOSIS (N/A)  Patient Location: PACU  Anesthesia Type:General  Level of Consciousness: awake, alert  and oriented  Airway and Oxygen Therapy: Patient Spontanous Breathing  Post-op Pain: mild  Post-op Assessment: Post-op Vital signs reviewed, Patient's Cardiovascular Status Stable, Respiratory Function Stable, Patent Airway, No signs of Nausea or vomiting and Pain level controlled  Post-op Vital Signs: Reviewed and stable  Last Vitals:  Filed Vitals:   02/20/14 1615  BP: 137/72  Pulse: 68  Temp:   Resp: 19    Complications: No apparent anesthesia complications

## 2014-02-20 NOTE — Op Note (Signed)
Preoperative diagnosis:  Recurrent uterine myomas, menorrhagia, ovarian cyst Postoperative diagnosis: Recurrent uterine myomas, menorrhagia, right ovarian cyst, endometriosis of pelvic peritoneum, stage I Procedure: Laparoscopy, robotic assisted myomectomy with GelPort assistance, right ovarian cystectomy, excision of endometriosis Anesthesia: Gen. endotracheal  Surgeon: Governor Specking  Assistant: Thurnell Lose, MD Complications: None  Estimated blood loss: 300 cc Specimens: Myoma specimens, peritoneal excisional biopsies to pathology.  Findings: On exam under anesthesia,  Cervix was grossly normal. The uterus sounded to 10 cm. Uterine corpus was enlarged to 15 gestational weeks size and firm and nodular. There were no nodularities or induration the posterior fornix On laparoscopy, liver edge, gallbladder and diaphragm surfaces were normal. The appendix was normal. The uterus was enlarged a centimeter transmural myoma that were generated in its right lateral wall and was encroaching into the right broad ligament amidst the uterine artery branches and was also protruding posteriorly. There was also a 3 x 2 cm pedunculated anterior myoma. Anterior cul-de-sac peritoneum was normal. Both fallopian tubes were normal. The right ovary was enlarged to 5 x 5 cm with a unilocular simple cyst and was located at the subumbilical level because of uterine enlargement. The left ovary was normal. Although the patient had previous myomectomy, there were no pelvic adhesions. The posterior cul-de-sac contained a peritoneal defect at appeared to overlie a deep endometriosis lesion. This was excised and submitted to pathology.  Description of the procedure: Patient was placed in lithotomy position and general endotracheal anesthesia was given. 2 g of cefazolin were given intravenously for prophylaxis. She was prepped and draped in sterile manner. A Foley catheter was inserted into the bladder appeared . A ZUMI uterine  manipulator was inserted into the uterus for uterine manipulation and chromotubation. The uterus sounded to 10 cm.   An operative field was created on the abdomen and the surgeon was regloved. After preemptive anesthesia with quarter percent bupivacaine and 1:200,000 epinephrine, and intraumbilical skin incision was made. We performed a left upper quadrant entry at the intersection of the left anterior axillary line and the costal margin because the ovarian cyst appeared to be at midline and underneath the umbilicus on preoperative ultrasound.  A Verress needle was inserted and pneumoperitoneum was created with carbon dioxide. A 5 mm trocar was placed at this incision. Under direct visualization with the laparoscope supraumbilical 12 mm incision was made and the trocar was inserted and the robotic laparoscope and camera was inserted through this port. Under direct visualization, 2 left lateral 65mm incisions were made and corresponding robotic trochars were placed. On the right side a third robotic trocar was placed as well as a 12 mm assistant port. The da Vinci SI robot was docked to the patient after placing her in Trendelenburg position. The surgeon continued the rest of the procedure from the surgical console.  First the large right ovarian cyst was aspirated with a cyst aspiration needle and the fluid was sent to cytology. The incision was extended to 3 cm and the cyst wall was removed completely and submitted to pathology. Hemostasis was insured on the remaining ovary. By the end of the seizure the edges of this ovary had come together and did not appear to require sutures to reconstruct the ovary. A dilute vasopressin solution (0.33 units per milliliter) was injected transcutaneously into the myometrium of the uterus. Using 72 W of current on the monopolar scissors, a right sterile lateral transverse incision was made over the large myoma. Applying tenaculum traction on the fibroid it was freed up  from  limits the surgical capsule by meticulous electrosurgical, and blunt dissection. Bipolar coagulation was performed when branches of right uterine artery were encountered or when there was a venous bleeding. A 0.5 cm entry into the endometrial cavity was made during the dissection medially. Finally the entire myoma could be extracted. Next the anterior pedunculated myoma was held on tension and was easily removed by electrosurgical dissection and bipolar coagulation method.  The myoma defect was closed 3 layers: the first and second layers were a 2-0 V-lock continuous suture and the third layer was a 4-0 PDS continuous suture.  We extended the supraumbilical incision to 3 cm by dissecting the anatomic layers and inserted a GelPort. The fibroids were removed by manual morcellation. At this point laparoscopy was repeated, hemostasis was insured and the pelvis was copiously irrigated and aspirated. Pelvis was copiously irrigated with lactated Ringer solution and a slurry of Seprafilm (2 sheets in 50 mL of lactated Ringer solution) was instilled into the pelvis as an adhesion barrier. The umbilical incision was closed with 0 Vicryl continuous suture. The subcutaneous tissue was approximated with 4-0 Monocryl suture. The skin incisions were approximated with 4-0 Monocryl subcuticular sutures. Dermabond was applied to all the skin incisions.  At this point the procedure was terminated hemostasis was insured. Instrument and lap pad count was correct.   The patient tolerated the procedure well and was transferred to recovery room in satisfactory condition.  Special Note: Due to to significant myometrial incisions, the patient is recommended to have cesarean delivery with future pregnancies.  Governor Specking

## 2014-02-20 NOTE — Transfer of Care (Signed)
Immediate Anesthesia Transfer of Care Note  Patient: [name]. [name]  Procedure(s) Performed: Procedure(s): ROBOTIC ASSISTED MYOMECTOMY (N/A) ROBOTIC ASSISTED LAPAROSCOPIC OVARIAN CYSTECTOMY (Right) ROBOTIC ASSISTED LAPAROSCOPIC LYSIS OF ADHESION,EXCISION OF ENDOMETRIOSIS (N/A)  Patient Location: PACU  Anesthesia Type:General  Level of Consciousness: awake, alert  and oriented  Airway & Oxygen Therapy: Patient Spontanous Breathing and Patient connected to nasal cannula oxygen  Post-op Assessment: Report given to PACU RN and Post -op Vital signs reviewed and stable  Post vital signs: Reviewed and stable  Complications: No apparent anesthesia complications

## 2014-02-21 MED FILL — Heparin Sodium (Porcine) Inj 5000 Unit/ML: INTRAMUSCULAR | Qty: 1 | Status: AC

## 2014-02-23 ENCOUNTER — Encounter (HOSPITAL_COMMUNITY): Payer: Self-pay | Admitting: Obstetrics and Gynecology

## 2014-03-12 ENCOUNTER — Other Ambulatory Visit: Payer: Self-pay | Admitting: Obstetrics and Gynecology

## 2014-03-12 ENCOUNTER — Other Ambulatory Visit (HOSPITAL_COMMUNITY)
Admission: RE | Admit: 2014-03-12 | Discharge: 2014-03-12 | Disposition: A | Discharge status: Home / Self Care | Payer: 59 | Source: Ambulatory Visit | Attending: Obstetrics and Gynecology | Admitting: Obstetrics and Gynecology

## 2014-03-12 DIAGNOSIS — Z01419 Encounter for gynecological examination (general) (routine) without abnormal findings: Secondary | ICD-10-CM | POA: Diagnosis not present

## 2014-03-16 LAB — CYTOLOGY - PAP

## 2015-03-22 ENCOUNTER — Other Ambulatory Visit (HOSPITAL_COMMUNITY)
Admission: RE | Admit: 2015-03-22 | Discharge: 2015-03-22 | Disposition: A | Discharge status: Home / Self Care | Payer: 59 | Source: Ambulatory Visit | Attending: Obstetrics and Gynecology | Admitting: Obstetrics and Gynecology

## 2015-03-22 ENCOUNTER — Other Ambulatory Visit: Payer: Self-pay | Admitting: Obstetrics and Gynecology

## 2015-03-22 DIAGNOSIS — Z01419 Encounter for gynecological examination (general) (routine) without abnormal findings: Secondary | ICD-10-CM | POA: Insufficient documentation

## 2015-03-22 DIAGNOSIS — Z1151 Encounter for screening for human papillomavirus (HPV): Secondary | ICD-10-CM | POA: Insufficient documentation

## 2015-03-24 LAB — CYTOLOGY - PAP

## 2016-08-09 ENCOUNTER — Inpatient Hospital Stay (HOSPITAL_COMMUNITY)
Admission: AD | Admit: 2016-08-09 | Discharge: 2016-08-10 | Disposition: A | Discharge status: Home / Self Care | Payer: 59 | Source: Ambulatory Visit | Attending: Obstetrics and Gynecology | Admitting: Obstetrics and Gynecology

## 2016-08-09 DIAGNOSIS — R102 Pelvic and perineal pain: Secondary | ICD-10-CM | POA: Insufficient documentation

## 2016-08-09 DIAGNOSIS — Z87898 Personal history of other specified conditions: Secondary | ICD-10-CM | POA: Insufficient documentation

## 2016-08-09 DIAGNOSIS — N939 Abnormal uterine and vaginal bleeding, unspecified: Secondary | ICD-10-CM | POA: Insufficient documentation

## 2016-08-09 DIAGNOSIS — D649 Anemia, unspecified: Secondary | ICD-10-CM | POA: Insufficient documentation

## 2016-08-10 ENCOUNTER — Encounter (HOSPITAL_COMMUNITY): Payer: Self-pay

## 2016-08-10 DIAGNOSIS — N939 Abnormal uterine and vaginal bleeding, unspecified: Secondary | ICD-10-CM | POA: Diagnosis not present

## 2016-08-10 DIAGNOSIS — R102 Pelvic and perineal pain: Secondary | ICD-10-CM | POA: Diagnosis not present

## 2016-08-10 DIAGNOSIS — Z87898 Personal history of other specified conditions: Secondary | ICD-10-CM | POA: Diagnosis not present

## 2016-08-10 DIAGNOSIS — D649 Anemia, unspecified: Secondary | ICD-10-CM | POA: Diagnosis not present

## 2016-08-10 LAB — CBC WITH DIFFERENTIAL/PLATELET
Basophils Absolute: 0 10*3/uL (ref 0.0–0.1)
Basophils Relative: 0 %
Eosinophils Absolute: 0.1 10*3/uL (ref 0.0–0.7)
Eosinophils Relative: 1 %
HCT: 34.3 % — ABNORMAL LOW (ref 36.0–46.0)
Hemoglobin: 11.8 g/dL — ABNORMAL LOW (ref 12.0–15.0)
Lymphocytes Relative: 31 %
Lymphs Abs: 1.9 10*3/uL (ref 0.7–4.0)
MCH: 29.1 pg (ref 26.0–34.0)
MCHC: 34.4 g/dL (ref 30.0–36.0)
MCV: 84.7 fL (ref 78.0–100.0)
Monocytes Absolute: 0.3 10*3/uL (ref 0.1–1.0)
Monocytes Relative: 4 %
Neutro Abs: 3.8 10*3/uL (ref 1.7–7.7)
Neutrophils Relative %: 64 %
Platelets: 297 10*3/uL (ref 150–400)
RBC: 4.05 MIL/uL (ref 3.87–5.11)
RDW: 12.5 % (ref 11.5–15.5)
WBC: 6 10*3/uL (ref 4.0–10.5)

## 2016-08-10 LAB — WET PREP, GENITAL
Clue Cells Wet Prep HPF POC: NONE SEEN
Sperm: NONE SEEN
Trich, Wet Prep: NONE SEEN
WBC, Wet Prep HPF POC: NONE SEEN
Yeast Wet Prep HPF POC: NONE SEEN

## 2016-08-10 LAB — GC/CHLAMYDIA PROBE AMP (~~LOC~~) NOT AT ARMC
Chlamydia: NEGATIVE
Neisseria Gonorrhea: NEGATIVE

## 2016-08-10 NOTE — Discharge Instructions (Signed)
Abnormal Uterine Bleeding Abnormal uterine bleeding can affect women at various stages in life, including teenagers, women in their reproductive years, pregnant women, and women who have reached menopause. Several kinds of uterine bleeding are considered abnormal, including:  Bleeding or spotting between periods.  Bleeding after sexual intercourse.  Bleeding that is heavier or more than normal.  Periods that last longer than usual.  Bleeding after menopause. Many cases of abnormal uterine bleeding are minor and simple to treat, while others are more serious. Any type of abnormal bleeding should be evaluated by your health care provider. Treatment will depend on the cause of the bleeding. Follow these instructions at home: Monitor your condition for any changes. The following actions may help to alleviate any discomfort you are experiencing:  Avoid the use of tampons and douches as directed by your health care provider.  Change your pads frequently. You should get regular pelvic exams and Pap tests. Keep all follow-up appointments for diagnostic tests as directed by your health care provider. Contact a health care provider if:  Your bleeding lasts more than 1 week.  You feel dizzy at times. Get help right away if:  You pass out.  You are changing pads every 15 to 30 minutes.  You have abdominal pain.  You have a fever.  You become sweaty or weak.  You are passing large blood clots from the vagina.  You start to feel nauseous and vomit. This information is not intended to replace advice given to you by your health care provider. Make sure you discuss any questions you have with your health care provider. Document Released: 06/19/2005 Document Revised: 12/01/2015 Document Reviewed: 01/16/2013 Elsevier Interactive Patient Education  2017 [employer]  

## 2016-08-10 NOTE — MAU Note (Signed)
Pt states that she started her period today and has saturated and had to change her pad every 1-2 hours since 6pm. . Has a hx of heavy periods and fibroids, but have never been this heavy before. Has some lower abdominal cramping. Took BC powder around 10pm, which has eased some of the pain.

## 2016-08-10 NOTE — MAU Provider Note (Signed)
History     CSN: [id number]  Arrival date and time: 08/09/16 2341   First Provider Initiated Contact with Patient 08/10/16 [phone]      Chief Complaint  Patient presents with  . Vaginal Bleeding   [name] is 36 y.o. G0P0000 with a known hx of fibroid. She was started on Sprintec about 3 months ago for heavy periods. She states that her periods have been worse since starting.    Vaginal Bleeding  The patient's primary symptoms include pelvic pain and vaginal bleeding. This is a new problem. The current episode started yesterday (started yesterday, but became much heavier around 1800. ). The problem occurs constantly. The problem has been gradually worsening. The patient is experiencing no pain (no pain currently, took a BC earlier for pain. ). She is not pregnant. Pertinent negatives include no chills, dysuria, fever, frequency, nausea, urgency or vomiting. The vaginal discharge was bloody. The vaginal bleeding is heavier than menses. She has been passing clots. She has not been passing tissue. Nothing aggravates the symptoms. Treatments tried: BC powder  The treatment provided significant relief. She is not sexually active. She uses oral contraceptives (sprintec, periods have been worse since starting. This the end of the 3rd pack. ) for contraception. Her menstrual history has been regular (08/08/16).    Past Medical History:  Diagnosis Date  . Anemia     Past Surgical History:  Procedure Laterality Date  . ROBOT ASSISTED MYOMECTOMY    . ROBOT ASSISTED MYOMECTOMY N/A 02/20/2014   Procedure: ROBOTIC ASSISTED MYOMECTOMY;  Surgeon: Governor Specking, MD;  Location: [facility];  Service: Gynecology;  Laterality: N/A;  . ROBOTIC ASSISTED LAPAROSCOPIC LYSIS OF ADHESION N/A 02/20/2014   Procedure: ROBOTIC ASSISTED LAPAROSCOPIC LYSIS OF ADHESION,EXCISION OF ENDOMETRIOSIS;  Surgeon: Governor Specking, MD;  Location: [facility];  Service: Gynecology;  Laterality: N/A;  . ROBOTIC ASSISTED LAPAROSCOPIC  OVARIAN CYSTECTOMY Right 02/20/2014   Procedure: ROBOTIC ASSISTED LAPAROSCOPIC OVARIAN CYSTECTOMY;  Surgeon: Governor Specking, MD;  Location: [facility];  Service: Gynecology;  Laterality: Right;    No family history on file.  Social History  Substance Use Topics  . Smoking status: Never Smoker  . Smokeless tobacco: Not on file  . Alcohol use Not on file    Allergies: No Known Allergies  Prescriptions Prior to Admission  Medication Sig Dispense Refill Last Dose  . FeFum-FePoly-FA-B Cmp-C-Biot (INTEGRA PLUS PO) Take 1 capsule by mouth daily.   Past Week at Unknown time  . ondansetron (ZOFRAN) 4 MG tablet Take 1 tablet (4 mg total) by mouth every 8 (eight) hours as needed for nausea or vomiting. 20 tablet 0   . oxyCODONE-acetaminophen (PERCOCET) 7.5-325 MG per tablet Take 1 tablet by mouth every 4 (four) hours as needed for pain. 30 tablet 0     Review of Systems  Constitutional: Negative for chills and fever.  Gastrointestinal: Negative for nausea and vomiting.  Genitourinary: Positive for pelvic pain and vaginal bleeding. Negative for dysuria, frequency and urgency.   Physical Exam   Blood pressure 152/98, pulse 119, temperature 99.5 F (37.5 C), temperature source Oral, resp. rate 20, height 5\' 5"  (1.651 m), weight 168 lb (76.2 kg), last menstrual period 08/09/2016, SpO2 100 %.  Physical Exam  Nursing note and vitals reviewed. Constitutional: She is oriented to person, place, and time. She appears well-developed and well-nourished. No distress.  HENT:  Head: Normocephalic.  Cardiovascular: Normal rate.   Respiratory: Effort normal.  GI: Soft. There is no tenderness. There is  no rebound.  Genitourinary:  Genitourinary Comments:  External: no lesion Vagina: moderate amount of blood and clots  Cervix: pink, smooth, no CMT Uterus: NSSC Adnexa: NT   Neurological: She is alert and oriented to person, place, and time.  Skin: Skin is warm and dry.  Psychiatric: She has a normal  mood and affect.    Results for orders placed or performed during the hospital encounter of 08/09/16 (from the past 24 hour(s))  Wet prep, genital     Status: None   Collection Time: 08/10/16 12:45 AM  Result Value Ref Range   Yeast Wet Prep HPF POC NONE SEEN NONE SEEN   Trich, Wet Prep NONE SEEN NONE SEEN   Clue Cells Wet Prep HPF POC NONE SEEN NONE SEEN   WBC, Wet Prep HPF POC NONE SEEN NONE SEEN   Sperm NONE SEEN   CBC with Differential/Platelet     Status: Abnormal   Collection Time: 08/10/16  1:06 AM  Result Value Ref Range   WBC 6.0 4.0 - 10.5 K/uL   RBC 4.05 3.87 - 5.11 MIL/uL   Hemoglobin 11.8 (L) 12.0 - 15.0 g/dL   HCT 34.3 (L) 36.0 - 46.0 %   MCV 84.7 78.0 - 100.0 fL   MCH 29.1 26.0 - 34.0 pg   MCHC 34.4 30.0 - 36.0 g/dL   RDW 12.5 11.5 - 15.5 %   Platelets 297 150 - 400 K/uL   Neutrophils Relative % 64 %   Neutro Abs 3.8 1.7 - 7.7 K/uL   Lymphocytes Relative 31 %   Lymphs Abs 1.9 0.7 - 4.0 K/uL   Monocytes Relative 4 %   Monocytes Absolute 0.3 0.1 - 1.0 K/uL   Eosinophils Relative 1 %   Eosinophils Absolute 0.1 0.0 - 0.7 K/uL   Basophils Relative 0 %   Basophils Absolute 0.0 0.0 - 0.1 K/uL    MAU Course  Procedures  MDM   Assessment and Plan   1. Abnormal uterine bleeding (AUB)    DC home Comfort measures reviewed  Bleeding precautions RX: none, may skip inactive pills of sprintec and start a new pack right away.  Consider Mirena IUD if bleeding continues  Return to MAU as needed FU with OB as planned  Follow-up Information    Catha Brow., MD Follow up.   Specialty:  Obstetrics and Gynecology Contact information: [id number] [place] [id number] [phone]            Mathis Bud 08/10/2016, 12:36 AM

## 2016-08-11 DIAGNOSIS — N920 Excessive and frequent menstruation with regular cycle: Secondary | ICD-10-CM | POA: Diagnosis not present

## 2016-09-06 DIAGNOSIS — M545 Low back pain: Secondary | ICD-10-CM | POA: Diagnosis not present

## 2016-09-13 DIAGNOSIS — M545 Low back pain: Secondary | ICD-10-CM | POA: Diagnosis not present

## 2016-12-18 DIAGNOSIS — M545 Low back pain: Secondary | ICD-10-CM | POA: Diagnosis not present

## 2017-05-08 DIAGNOSIS — Z01419 Encounter for gynecological examination (general) (routine) without abnormal findings: Secondary | ICD-10-CM | POA: Diagnosis not present

## 2017-05-21 DIAGNOSIS — S339XXA Sprain of unspecified parts of lumbar spine and pelvis, initial encounter: Secondary | ICD-10-CM | POA: Diagnosis not present

## 2017-05-21 DIAGNOSIS — M545 Low back pain: Secondary | ICD-10-CM | POA: Diagnosis not present

## 2017-05-22 DIAGNOSIS — S339XXA Sprain of unspecified parts of lumbar spine and pelvis, initial encounter: Secondary | ICD-10-CM | POA: Diagnosis not present

## 2017-05-22 DIAGNOSIS — M545 Low back pain: Secondary | ICD-10-CM | POA: Diagnosis not present

## 2017-05-28 DIAGNOSIS — S339XXA Sprain of unspecified parts of lumbar spine and pelvis, initial encounter: Secondary | ICD-10-CM | POA: Diagnosis not present

## 2017-05-28 DIAGNOSIS — M545 Low back pain: Secondary | ICD-10-CM | POA: Diagnosis not present

## 2017-05-29 DIAGNOSIS — S339XXA Sprain of unspecified parts of lumbar spine and pelvis, initial encounter: Secondary | ICD-10-CM | POA: Diagnosis not present

## 2017-05-29 DIAGNOSIS — M545 Low back pain: Secondary | ICD-10-CM | POA: Diagnosis not present

## 2017-06-04 DIAGNOSIS — M545 Low back pain: Secondary | ICD-10-CM | POA: Diagnosis not present

## 2017-06-04 DIAGNOSIS — S339XXA Sprain of unspecified parts of lumbar spine and pelvis, initial encounter: Secondary | ICD-10-CM | POA: Diagnosis not present

## 2017-06-05 DIAGNOSIS — M545 Low back pain: Secondary | ICD-10-CM | POA: Diagnosis not present

## 2017-06-05 DIAGNOSIS — S339XXA Sprain of unspecified parts of lumbar spine and pelvis, initial encounter: Secondary | ICD-10-CM | POA: Diagnosis not present

## 2017-06-18 DIAGNOSIS — S339XXA Sprain of unspecified parts of lumbar spine and pelvis, initial encounter: Secondary | ICD-10-CM | POA: Diagnosis not present

## 2017-06-18 DIAGNOSIS — M545 Low back pain: Secondary | ICD-10-CM | POA: Diagnosis not present

## 2017-06-19 DIAGNOSIS — M545 Low back pain: Secondary | ICD-10-CM | POA: Diagnosis not present

## 2017-06-19 DIAGNOSIS — S339XXA Sprain of unspecified parts of lumbar spine and pelvis, initial encounter: Secondary | ICD-10-CM | POA: Diagnosis not present

## 2017-06-27 DIAGNOSIS — S339XXA Sprain of unspecified parts of lumbar spine and pelvis, initial encounter: Secondary | ICD-10-CM | POA: Diagnosis not present

## 2017-06-27 DIAGNOSIS — M545 Low back pain: Secondary | ICD-10-CM | POA: Diagnosis not present

## 2017-06-28 DIAGNOSIS — S339XXA Sprain of unspecified parts of lumbar spine and pelvis, initial encounter: Secondary | ICD-10-CM | POA: Diagnosis not present

## 2017-06-28 DIAGNOSIS — M545 Low back pain: Secondary | ICD-10-CM | POA: Diagnosis not present

## 2017-07-04 DIAGNOSIS — S339XXA Sprain of unspecified parts of lumbar spine and pelvis, initial encounter: Secondary | ICD-10-CM | POA: Diagnosis not present

## 2017-07-04 DIAGNOSIS — M545 Low back pain: Secondary | ICD-10-CM | POA: Diagnosis not present

## 2017-07-05 DIAGNOSIS — S339XXA Sprain of unspecified parts of lumbar spine and pelvis, initial encounter: Secondary | ICD-10-CM | POA: Diagnosis not present

## 2017-07-05 DIAGNOSIS — M545 Low back pain: Secondary | ICD-10-CM | POA: Diagnosis not present

## 2017-08-02 ENCOUNTER — Other Ambulatory Visit: Payer: Self-pay | Admitting: Family Medicine

## 2017-08-02 ENCOUNTER — Ambulatory Visit
Admission: RE | Admit: 2017-08-02 | Discharge: 2017-08-02 | Disposition: A | Discharge status: Home / Self Care | Payer: 59 | Source: Ambulatory Visit | Attending: Family Medicine | Admitting: Family Medicine

## 2017-08-02 DIAGNOSIS — R0789 Other chest pain: Secondary | ICD-10-CM | POA: Diagnosis not present

## 2017-08-02 DIAGNOSIS — D649 Anemia, unspecified: Secondary | ICD-10-CM | POA: Diagnosis not present

## 2017-08-02 DIAGNOSIS — Z13220 Encounter for screening for lipoid disorders: Secondary | ICD-10-CM | POA: Diagnosis not present

## 2017-08-02 DIAGNOSIS — Z0000 Encounter for general adult medical examination without abnormal findings: Secondary | ICD-10-CM | POA: Diagnosis not present

## 2017-09-03 DIAGNOSIS — N644 Mastodynia: Secondary | ICD-10-CM | POA: Diagnosis not present

## 2017-09-03 DIAGNOSIS — D649 Anemia, unspecified: Secondary | ICD-10-CM | POA: Diagnosis not present

## 2017-09-05 ENCOUNTER — Other Ambulatory Visit: Payer: Self-pay | Admitting: Family Medicine

## 2017-09-05 DIAGNOSIS — N644 Mastodynia: Secondary | ICD-10-CM

## 2017-09-11 ENCOUNTER — Ambulatory Visit
Admission: RE | Admit: 2017-09-11 | Discharge: 2017-09-11 | Disposition: A | Discharge status: Home / Self Care | Payer: 59 | Source: Ambulatory Visit | Attending: Family Medicine | Admitting: Family Medicine

## 2017-09-11 DIAGNOSIS — N644 Mastodynia: Secondary | ICD-10-CM

## 2017-09-11 DIAGNOSIS — R922 Inconclusive mammogram: Secondary | ICD-10-CM | POA: Diagnosis not present

## 2018-06-17 ENCOUNTER — Other Ambulatory Visit (HOSPITAL_COMMUNITY)
Admission: RE | Admit: 2018-06-17 | Discharge: 2018-06-17 | Disposition: A | Discharge status: Home / Self Care | Payer: 59 | Source: Ambulatory Visit | Attending: Obstetrics and Gynecology | Admitting: Obstetrics and Gynecology

## 2018-06-17 ENCOUNTER — Other Ambulatory Visit: Payer: Self-pay | Admitting: Obstetrics and Gynecology

## 2018-06-17 DIAGNOSIS — Z01419 Encounter for gynecological examination (general) (routine) without abnormal findings: Secondary | ICD-10-CM | POA: Diagnosis not present

## 2018-06-19 LAB — CYTOLOGY - PAP
Diagnosis: NEGATIVE
HPV: NOT DETECTED

## 2018-08-05 DIAGNOSIS — Z13220 Encounter for screening for lipoid disorders: Secondary | ICD-10-CM | POA: Diagnosis not present

## 2018-08-05 DIAGNOSIS — D649 Anemia, unspecified: Secondary | ICD-10-CM | POA: Diagnosis not present

## 2018-08-05 DIAGNOSIS — Z131 Encounter for screening for diabetes mellitus: Secondary | ICD-10-CM | POA: Diagnosis not present

## 2018-08-05 DIAGNOSIS — Z23 Encounter for immunization: Secondary | ICD-10-CM | POA: Diagnosis not present

## 2018-08-05 DIAGNOSIS — R6889 Other general symptoms and signs: Secondary | ICD-10-CM | POA: Diagnosis not present

## 2018-08-05 DIAGNOSIS — Z0000 Encounter for general adult medical examination without abnormal findings: Secondary | ICD-10-CM | POA: Diagnosis not present

## 2018-08-30 DIAGNOSIS — K625 Hemorrhage of anus and rectum: Secondary | ICD-10-CM | POA: Diagnosis not present

## 2019-02-06 ENCOUNTER — Encounter: Payer: Self-pay | Admitting: Allergy

## 2019-02-06 ENCOUNTER — Other Ambulatory Visit: Payer: Self-pay

## 2019-02-06 ENCOUNTER — Ambulatory Visit (INDEPENDENT_AMBULATORY_CARE_PROVIDER_SITE_OTHER): Payer: 59 | Admitting: Allergy

## 2019-02-06 VITALS — BP 122/74 | HR 64 | Temp 98.7°F | Resp 18 | Ht 66.0 in | Wt 168.1 lb

## 2019-02-06 DIAGNOSIS — R21 Rash and other nonspecific skin eruption: Secondary | ICD-10-CM | POA: Diagnosis not present

## 2019-02-06 NOTE — Assessment & Plan Note (Signed)
2 episodes of rash/pruritus within the past 1 year. Not sure of first trigger but second may have been triggered by a raspberry arbor mist alcoholic drink. She tolerates other forms of alcoholic beverages with no issues.   Based on clinical history, it is difficult to decipher what may have caused these episodes. Given the infrequent episodes there is no clinical indication for any type of skin testing today.   Continue to avoid raspberry Arbor mist.  If you get another episode of rash/itching - take pictures and write down what you had eaten or come in contact within the 12 hour period.  Discussed proper skin care.  May use over the counter antihistamines such as benadryl, Zyrtec (cetirizine), Claritin (loratadine), Allegra (fexofenadine), or Xyzal (levocetirizine) daily as needed.  May use over the counter cortisone cream as needed.  If having more frequent episodes then recommend patch testing next.

## 2019-02-06 NOTE — Progress Notes (Signed)
New Patient Note  RE: [name] MRN: [id number] DOB: 17-Oct-1980 Date of Office Visit: 02/06/2019  Referring provider: No ref. provider found Primary care provider: London Pepper, MD  Chief Complaint: Allergic Reaction (itching and hives after using some kind of lotion. unsure of scent.that occurred last year. she also consumed raspberry arbor mist. and had similar symptoms. that occurred a couple of months ago. )  History of Present Illness: I had the pleasure of seeing [name] for initial evaluation at the [facility] on 02/06/2019. She is a 38 y.o. female, who is self-referred here for the evaluation of hives/rash/allergic reaction.   Rash: About 1 year ago patient noticed upper body pruritus at night. She is not sure of any triggers but this pruritus/rash reoccurred for a few days only at night. She can't recall any changes in laundry detergents or personal care products.   More recently she had a similar episode with itching and breaking out in flat erythematous patches after drinking a Raspberry alcohol Arbor Mist drink. Symptoms resolved within the same day. She had other alcoholic beverages since then with no issues. She usually does not eat raspberries.   She also noted some dry patches of skin on her hands. Using goldbond and other moisturizer as needed with good benefit. She is concerned if she has eczema or what's flaring her symptoms.   Assessment and Plan: [name] is a 38 y.o. female with: Rash and other nonspecific skin eruption 2 episodes of rash/pruritus within the past 1 year. Not sure of first trigger but second may have been triggered by a raspberry arbor mist alcoholic drink. She tolerates other forms of alcoholic beverages with no issues.   Based on clinical history, it is difficult to decipher what may have caused these episodes. Given the infrequent episodes there is no clinical indication for any type of skin testing today.   Continue  to avoid raspberry Arbor mist.  If you get another episode of rash/itching - take pictures and write down what you had eaten or come in contact within the 12 hour period.  Discussed proper skin care.  May use over the counter antihistamines such as benadryl, Zyrtec (cetirizine), Claritin (loratadine), Allegra (fexofenadine), or Xyzal (levocetirizine) daily as needed.  May use over the counter cortisone cream as needed.  If having more frequent episodes then recommend patch testing next.   Return if symptoms worsen or fail to improve.  Other allergy screening: Asthma: no Rhino conjunctivitis: yes  Sneezing during the spring time. Does not take any medications for this.  Food allergy: no  Dietary History: patient has been eating other foods including limited milk, eggs, peanut, treenuts, sesame, shellfish, seafood, soy, wheat, meats, fruits and vegetables.   Medication allergy: no Hymenoptera allergy: no Urticaria: no Eczema:no History of recurrent infections suggestive of immunodeficency: no  Diagnostics: None.  Past Medical History: Patient Active Problem List   Diagnosis Date Noted  . Rash and other nonspecific skin eruption 02/06/2019   Past Medical History:  Diagnosis Date  . Anemia   . Urticaria    Past Surgical History: Past Surgical History:  Procedure Laterality Date  . ROBOT ASSISTED MYOMECTOMY    . ROBOT ASSISTED MYOMECTOMY N/A 02/20/2014   Procedure: ROBOTIC ASSISTED MYOMECTOMY;  Surgeon: Governor Specking, MD;  Location: [facility];  Service: Gynecology;  Laterality: N/A;  . ROBOTIC ASSISTED LAPAROSCOPIC LYSIS OF ADHESION N/A 02/20/2014   Procedure: ROBOTIC ASSISTED LAPAROSCOPIC LYSIS OF ADHESION,EXCISION OF ENDOMETRIOSIS;  Surgeon:  Governor Specking, MD;  Location: [facility];  Service: Gynecology;  Laterality: N/A;  . ROBOTIC ASSISTED LAPAROSCOPIC OVARIAN CYSTECTOMY Right 02/20/2014   Procedure: ROBOTIC ASSISTED LAPAROSCOPIC OVARIAN CYSTECTOMY;  Surgeon: Governor Specking, MD;  Location: [facility];  Service: Gynecology;  Laterality: Right;   Medication List:  Current Outpatient Medications  Medication Sig Dispense Refill  . Ferrous Sulfate (IRON) 325 (65 Fe) MG TABS Take 1 tablet by mouth daily.    . Multiple Vitamins-Minerals (ONE-A-DAY WOMENS PO) Take 1 tablet by mouth daily.     No current facility-administered medications for this visit.    Allergies: No Known Allergies Social History: Social History   Socioeconomic History  . Marital status: Single    Spouse name: Not on file  . Number of children: Not on file  . Years of education: Not on file  . Highest education level: Not on file  Occupational History  . Not on file  Social Needs  . Financial resource strain: Not on file  . Food insecurity    Worry: Not on file    Inability: Not on file  . Transportation needs    Medical: Not on file    Non-medical: Not on file  Tobacco Use  . Smoking status: Never Smoker  . Smokeless tobacco: Never Used  Substance and Sexual Activity  . Alcohol use: Yes    Comment: very rare  . Drug use: Never  . Sexual activity: Never  Lifestyle  . Physical activity    Days per week: Not on file    Minutes per session: Not on file  . Stress: Not on file  Relationships  . Social Herbalist on phone: Not on file    Gets together: Not on file    Attends religious service: Not on file    Active member of club or organization: Not on file    Attends meetings of clubs or organizations: Not on file    Relationship status: Not on file  Other Topics Concern  . Not on file  Social History Narrative  . Not on file   Lives in an apartment. Smoking: denies Occupation: Psychologist, educational.   Environmental History: Water Damage/mildew in the house: no Carpet in the family room: no Carpet in the bedroom: yes Heating: electric Cooling: central Pet: no  Family History: Family History  Problem Relation Age of Onset  . Asthma  Mother    Review of Systems  Constitutional: Negative for appetite change, chills, fever and unexpected weight change.  HENT: Negative for congestion and rhinorrhea.   Eyes: Negative for itching.  Respiratory: Negative for cough, chest tightness, shortness of breath and wheezing.   Cardiovascular: Negative for chest pain.  Gastrointestinal: Negative for abdominal pain.  Genitourinary: Negative for difficulty urinating.  Skin: Positive for rash.  Allergic/Immunologic: Negative for environmental allergies and food allergies.  Neurological: Negative for headaches.   Objective: BP 122/74 (BP Location: Right Arm, Patient Position: Sitting, Cuff Size: Normal)   Pulse 64   Temp 98.7 F (37.1 C) (Temporal)   Resp 18   Ht 5\' 6"  (1.676 m)   Wt 168 lb 1.9 oz (76.3 kg)   SpO2 99%   BMI 27.14 kg/m  Body mass index is 27.14 kg/m. Physical Exam  Constitutional: She is oriented to person, place, and time. She appears well-developed and well-nourished.  HENT:  Head: Normocephalic and atraumatic.  Right Ear: External ear normal.  Left Ear: External ear normal.  Nose: Nose normal.  Mouth/Throat: Oropharynx is clear and moist.  Eyes: Conjunctivae and EOM are normal.  Neck: Neck supple.  Cardiovascular: Normal rate, regular rhythm and normal heart sounds. Exam reveals no gallop and no friction rub.  No murmur heard. Pulmonary/Chest: Effort normal and breath sounds normal. She has no wheezes. She has no rales.  Abdominal: Soft.  Neurological: She is alert and oriented to person, place, and time.  Skin: Skin is warm. No rash noted.  Psychiatric: She has a normal mood and affect. Her behavior is normal.  Nursing note and vitals reviewed.  The plan was reviewed with the patient/family, and all questions/concerned were addressed.  It was my pleasure to see [name] today and participate in her care. Please feel free to contact me with any questions or concerns.  Sincerely,  Rexene Alberts, DO  Allergy & Immunology  [facility] office: [phone] [facility] office: [place] office: [phone]

## 2019-02-06 NOTE — Patient Instructions (Addendum)
Continue to avoid raspberry Arbor mist. If you get another episode of rash/itching - take pictures and write down what you had eaten, done or come across within the 12 hour period.  See below for skin care recommendations.  Follow up as needed.    Skin care recommendations  Bath time: . Always use lukewarm water. AVOID very hot or cold water. Marland Kitchen Keep bathing time to 5-10 minutes. . Do NOT use bubble bath. . Use a mild soap and use just enough to wash the dirty areas. . Do NOT scrub skin vigorously.  . After bathing, pat dry your skin with a towel. Do NOT rub or scrub the skin.  Moisturizers and prescriptions:  . ALWAYS apply moisturizers immediately after bathing (within 3 minutes). This helps to lock-in moisture. . Use the moisturizer several times a day over the whole body. Kermit Balo summer moisturizers include: Aveeno, CeraVe, Cetaphil. Kermit Balo winter moisturizers include: Aquaphor, Vaseline, Cerave, Cetaphil, Eucerin, Vanicream. . When using moisturizers along with medications, the moisturizer should be applied about one hour after applying the medication to prevent diluting effect of the medication or moisturize around where you applied the medications. When not using medications, the moisturizer can be continued twice daily as maintenance.  Laundry and clothing: . Avoid laundry products with added color or perfumes. . Use unscented hypo-allergenic laundry products such as Tide free, Cheer free & gentle, and All free and clear.  . If the skin still seems dry or sensitive, you can try double-rinsing the clothes. . Avoid tight or scratchy clothing such as wool. . Do not use fabric softeners or dyer sheets.

## 2019-10-25 IMAGING — DX DG CHEST 2V
2 series · 2 of 2 positions shown · non-contrast
Comparison: None.

CLINICAL DATA: Mid to bilateral chest pain and tenderness under the
breast 1 month.

EXAM:
CHEST  2 VIEW

[dg chest 2 view (1 of 2)]
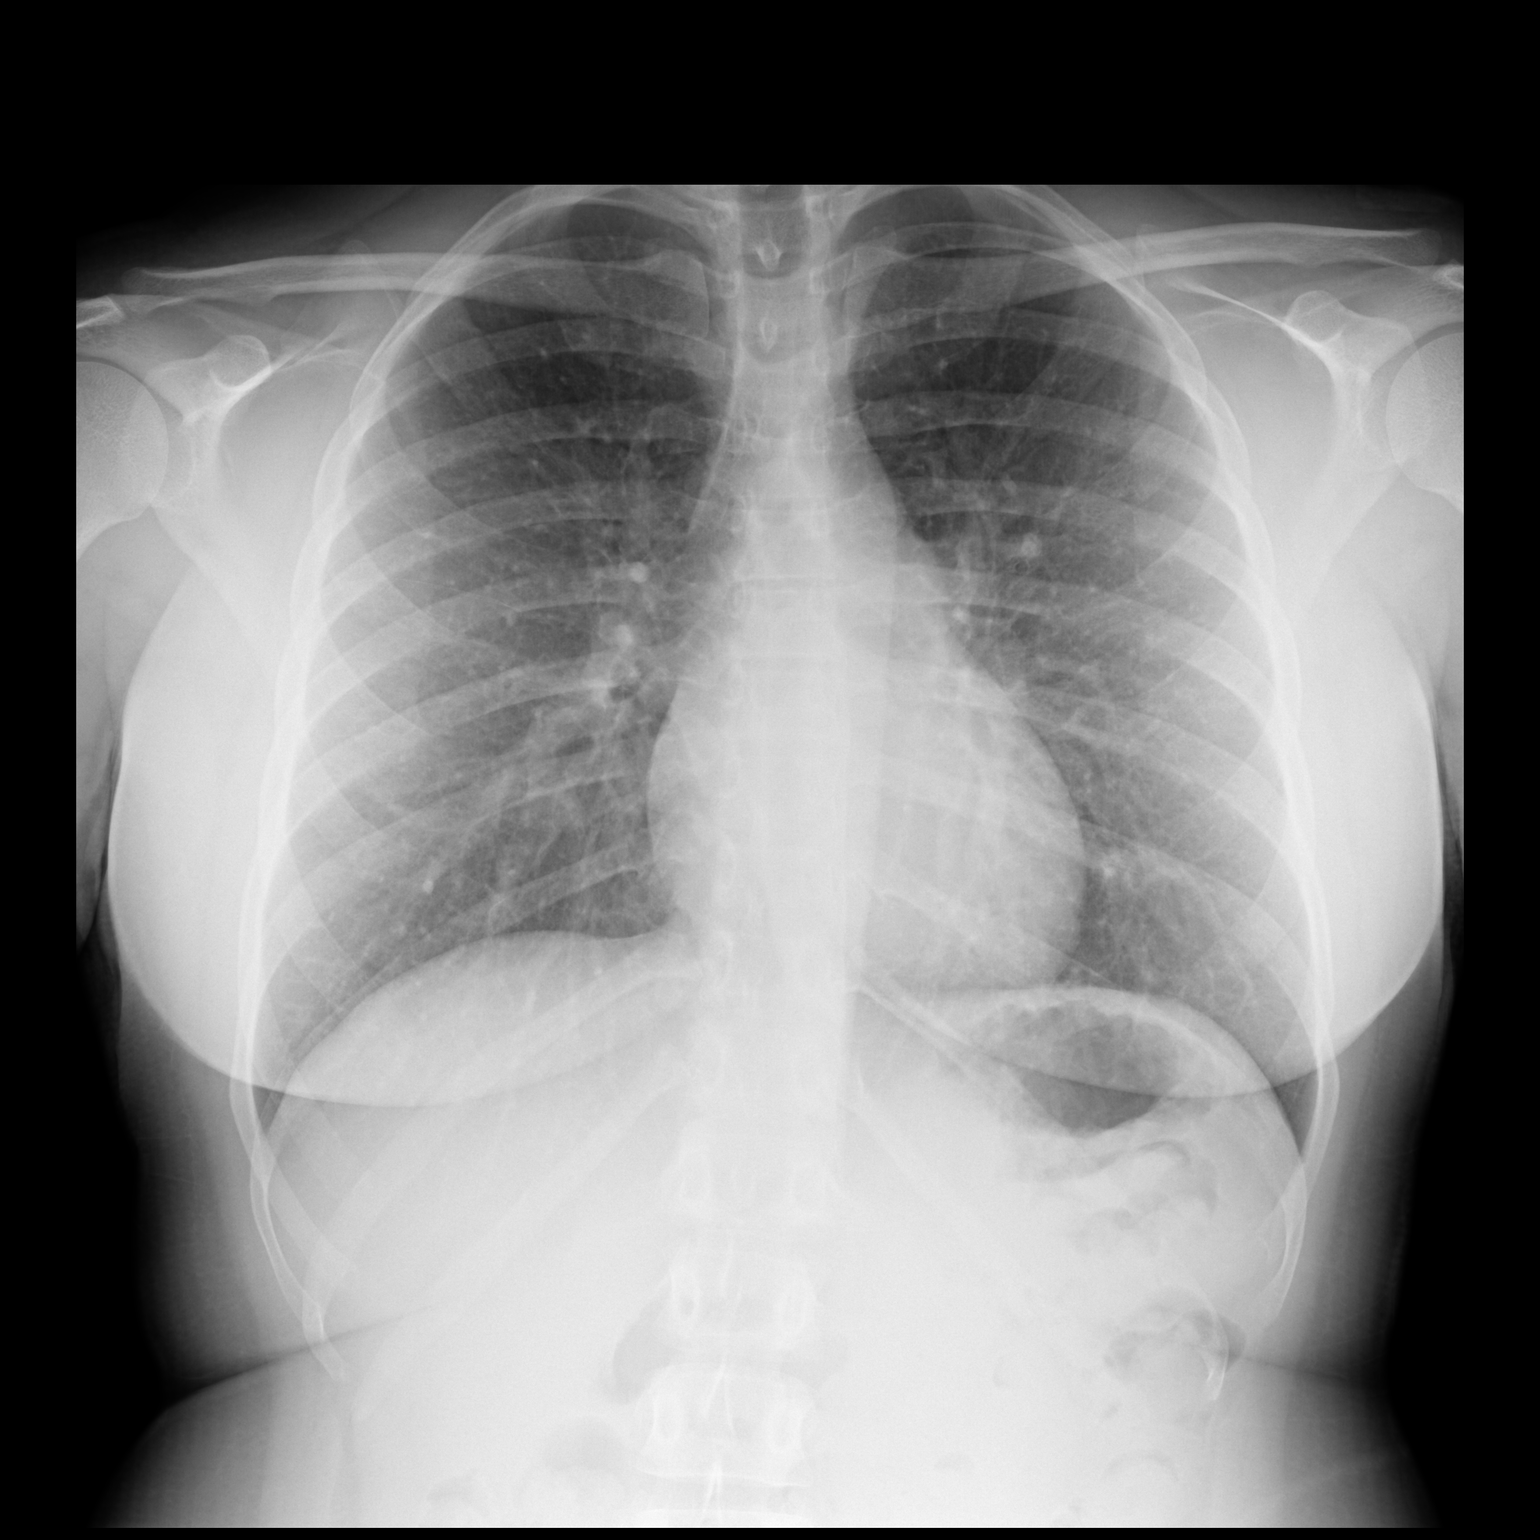

[dg chest 2 view (2 of 2)]
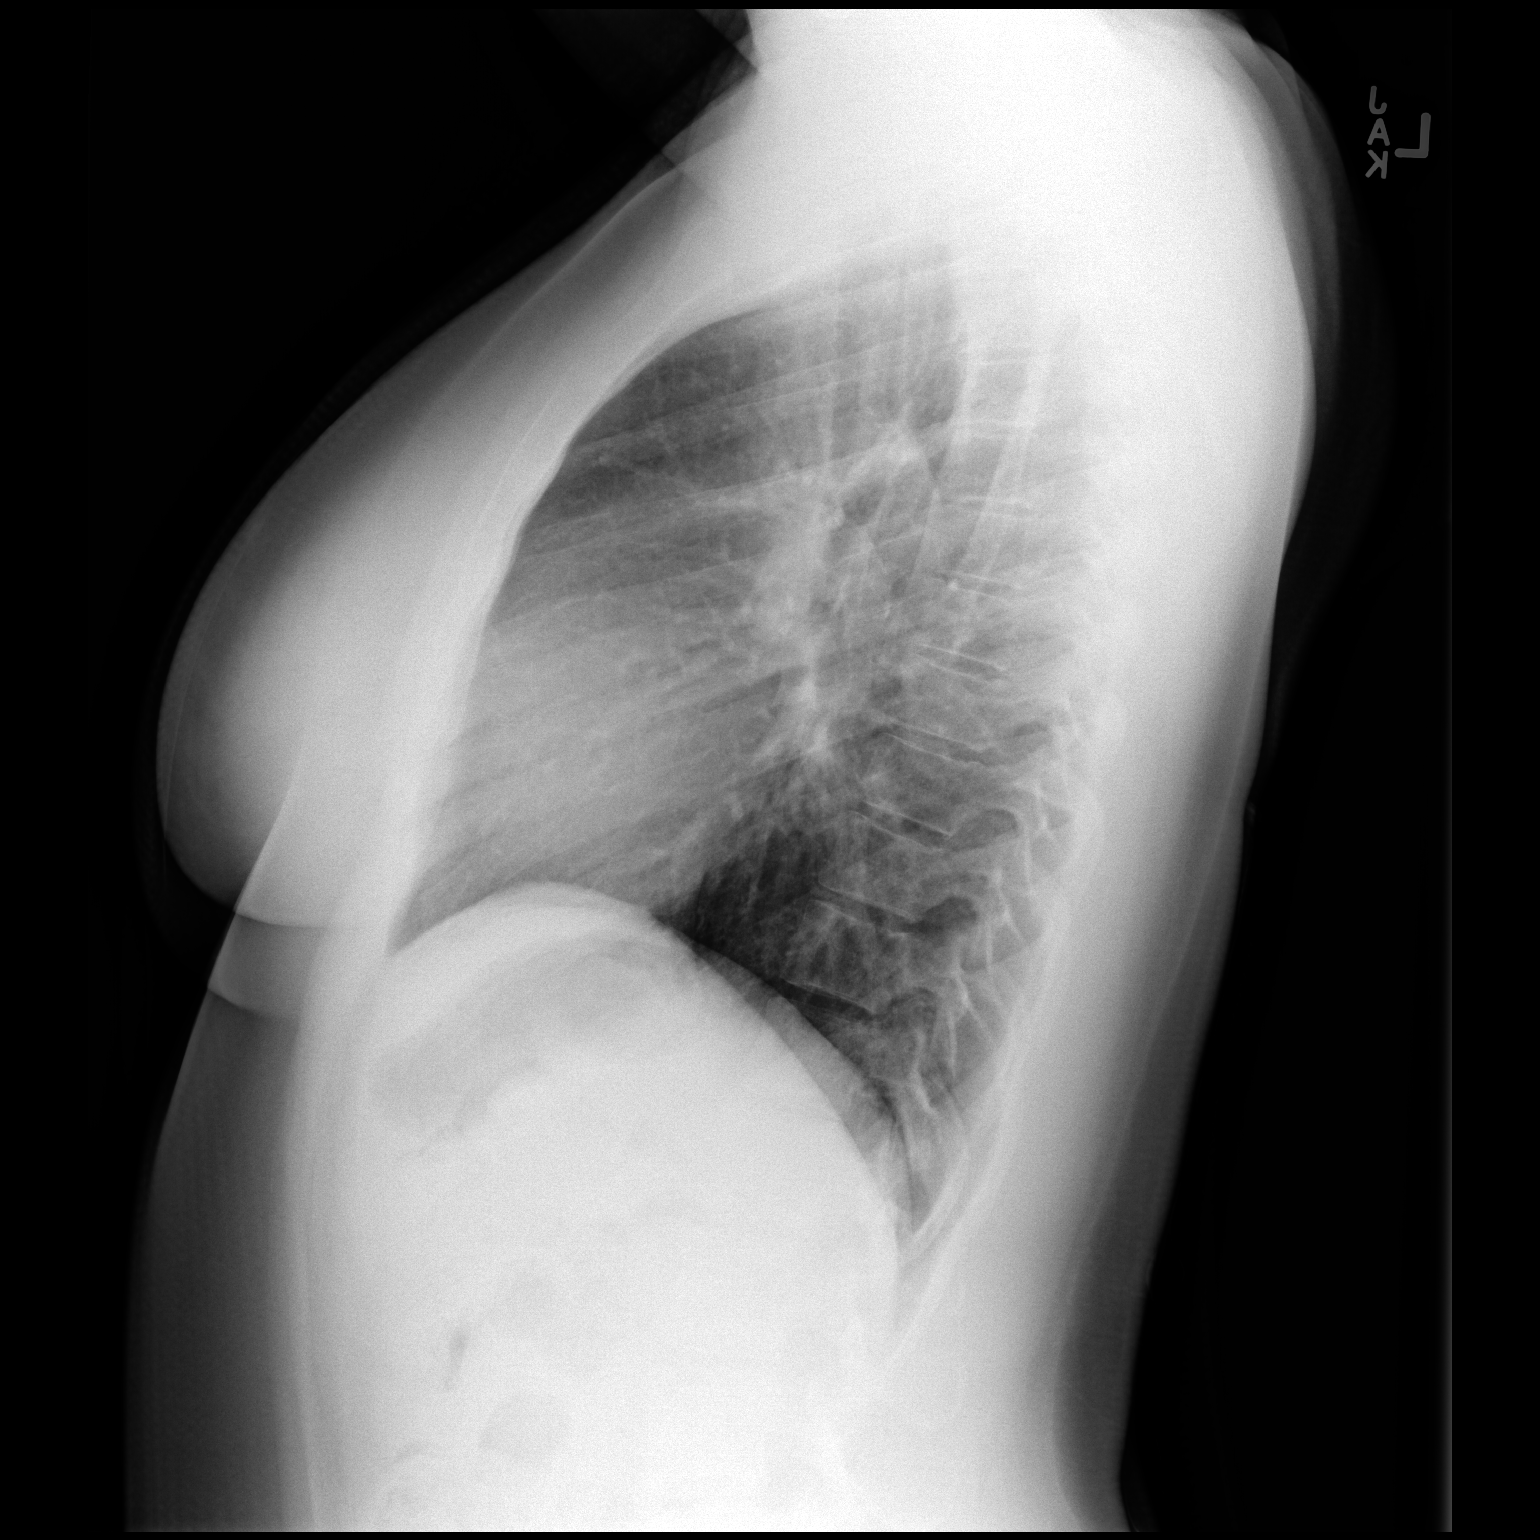

[2 of 2 positions shown; findings below may reference images not displayed]

FINDINGS: Lungs are adequately inflated and otherwise clear. Cardiomediastinal
silhouette, bones and soft tissues are normal.
IMPRESSION: No active cardiopulmonary disease.

## 2020-05-11 ENCOUNTER — Ambulatory Visit (INDEPENDENT_AMBULATORY_CARE_PROVIDER_SITE_OTHER): Payer: 59 | Admitting: Allergy and Immunology

## 2020-05-11 ENCOUNTER — Other Ambulatory Visit: Payer: Self-pay

## 2020-05-11 VITALS — BP 122/82 | HR 73 | Temp 98.2°F | Resp 14 | Ht 65.75 in | Wt 175.4 lb

## 2020-05-11 DIAGNOSIS — J3089 Other allergic rhinitis: Secondary | ICD-10-CM

## 2020-05-11 DIAGNOSIS — J301 Allergic rhinitis due to pollen: Secondary | ICD-10-CM | POA: Diagnosis not present

## 2020-05-11 MED ORDER — MONTELUKAST SODIUM 10 MG PO TABS: 10.0000 mg | ORAL_TABLET | Freq: Every day | ORAL | 5 refills | Status: DC

## 2020-05-11 NOTE — Progress Notes (Signed)
[facility]   Follow-up Note  Referring Provider: London Pepper, MD Primary Provider: London Pepper, MD Date of Office Visit: 05/11/2020  Subjective:   [name] (DOB: 08/04/1980) is a 39 y.o. female who returns to the [facility] on 05/11/2020 in re-evaluation of the following:  HPI: Camari returns to this clinic in evaluation of respiratory tract symptoms.  She has been seen in this clinic for her initial evaluation by Dr. Maudie Mercury on 06 February 2019 at which point time she appeared to be having an adverse effect to a specific alcoholic drink manifested for the most part as a cutaneous reaction.  Fortunately, that issue has completely resolved as she has not had consumption of that alcoholic drink.  What is bothering Fedora the most is that she has been having unrelenting sneezing with some nasal congestion since August 2021.  There is no obvious provoking factor giving rise to this issue as she has not really had a significant environmental change or started any new medications or supplements or herbs or energy boosters and she has not started any new hobbies with particulate matter exposure.  She has tried some over-the-counter medications which helped somewhat.  She does have a history of springtime nasal congestion and sneezing for which she would occasionally take an antihistamine the past several years.  At this point she will not be receiving the Covid vaccine or the flu vaccine.  Allergies as of 05/11/2020   No Known Allergies     Medication List      calcium carbonate 1500 (600 Ca) MG Tabs tablet Commonly known as: OSCAL Take by mouth 2 (two) times daily with a meal.   folic acid 938 MCG tablet Commonly known as: FOLVITE Take 400 mcg by mouth daily.   Iron 325 (65 Fe) MG Tabs Take 1 tablet by mouth daily.   ONE-A-DAY WOMENS PO Take 1 tablet by mouth daily.       Past Medical History:  Diagnosis  Date  . Anemia   . Urticaria     Past Surgical History:  Procedure Laterality Date  . ROBOT ASSISTED MYOMECTOMY    . ROBOT ASSISTED MYOMECTOMY N/A 02/20/2014   Procedure: ROBOTIC ASSISTED MYOMECTOMY;  Surgeon: Governor Specking, MD;  Location: [facility];  Service: Gynecology;  Laterality: N/A;  . ROBOTIC ASSISTED LAPAROSCOPIC LYSIS OF ADHESION N/A 02/20/2014   Procedure: ROBOTIC ASSISTED LAPAROSCOPIC LYSIS OF ADHESION,EXCISION OF ENDOMETRIOSIS;  Surgeon: Governor Specking, MD;  Location: [facility];  Service: Gynecology;  Laterality: N/A;  . ROBOTIC ASSISTED LAPAROSCOPIC OVARIAN CYSTECTOMY Right 02/20/2014   Procedure: ROBOTIC ASSISTED LAPAROSCOPIC OVARIAN CYSTECTOMY;  Surgeon: Governor Specking, MD;  Location: [facility];  Service: Gynecology;  Laterality: Right;    Review of systems negative except as noted in HPI / PMHx or noted below:  Review of Systems  Constitutional: Negative.   HENT: Negative.   Eyes: Negative.   Respiratory: Negative.   Cardiovascular: Negative.   Gastrointestinal: Negative.   Genitourinary: Negative.   Musculoskeletal: Negative.   Skin: Negative.   Neurological: Negative.   Endo/Heme/Allergies: Negative.   Psychiatric/Behavioral: Negative.      Objective:   Vitals:   05/11/20 1438  BP: 122/82  Pulse: 73  Resp: 14  Temp: 98.2 F (36.8 C)  SpO2: 100%   Height: 5' 5.75" (167 cm)  Weight: 175 lb 6.4 oz (79.6 kg)   Physical Exam Constitutional:      Appearance: She is not  diaphoretic.  HENT:     Head: Normocephalic.     Right Ear: Tympanic membrane, ear canal and external ear normal.     Left Ear: Tympanic membrane, ear canal and external ear normal.     Nose: Mucosal edema present. No rhinorrhea.     Mouth/Throat:     Pharynx: Uvula midline. No oropharyngeal exudate.  Eyes:     Conjunctiva/sclera: Conjunctivae normal.  Neck:     Thyroid: No thyromegaly.     Trachea: Trachea normal. No tracheal tenderness or tracheal deviation.  Cardiovascular:      Rate and Rhythm: Normal rate and regular rhythm.     Heart sounds: Normal heart sounds, S1 normal and S2 normal. No murmur heard.   Pulmonary:     Effort: No respiratory distress.     Breath sounds: Normal breath sounds. No stridor. No wheezing or rales.  Lymphadenopathy:     Head:     Right side of head: No tonsillar adenopathy.     Left side of head: No tonsillar adenopathy.     Cervical: No cervical adenopathy.  Skin:    Findings: No erythema or rash.     Nails: There is no clubbing.  Neurological:     Mental Status: She is alert.     Diagnostics: Allergy skin tests were performed.  She demonstrated hypersensitivity against trees, grasses, weeds, house dust mite.  Assessment and Plan:   1. Perennial allergic rhinitis   2. Seasonal allergic rhinitis due to pollen     1.  Allergen avoidance measures -pollens, dust mite  2.  Treat and prevent inflammation:   A. Montelukast 10 mg - 1 tablet 1 time per day  B. OTC Rhinocort / budesonide - 1-2 sprays each nostril 3-7 times per week  3. If needed:   A. OTC Claritin/loratadine or Zyrtec/cetirizine 10 mg - 1 tablet 1 time per day  4.  Further evaluation and treatment?  Contact this clinic in 3 weeks with an update regarding response to treatment noted above  Maeli should do quite well with some attention to allergen avoidance measures and the consistent use of some anti-inflammatory medications as noted above.  I have asked her to contact this clinic in 3 weeks noting her response to that approach and will make a determination about further evaluation and treatment based upon her response.  If she fails medical therapy she would be a candidate for immunotherapy.  Allena Katz, MD Allergy / Immunology [facility]

## 2020-05-11 NOTE — Patient Instructions (Addendum)
  1.  Allergen avoidance measures -pollens, dust mite  2.  Treat and prevent inflammation:   A. Montelukast 10 mg - 1 tablet 1 time per day  B. OTC Rhinocort / budesonide - 1-2 sprays each nostril 3-7 times per week  3. If needed:   A. OTC Claritin/loratadine or Zyrtec/cetirizine 10 mg - 1 tablet 1 time per day  4.  Further evaluation and treatment?  Contact this clinic in 3 weeks with an update regarding response to treatment noted above

## 2020-05-12 ENCOUNTER — Encounter: Payer: Self-pay | Admitting: Allergy and Immunology

## 2020-06-11 ENCOUNTER — Other Ambulatory Visit: Payer: Self-pay | Admitting: Family Medicine

## 2020-06-11 DIAGNOSIS — Z0000 Encounter for general adult medical examination without abnormal findings: Secondary | ICD-10-CM

## 2020-06-14 ENCOUNTER — Telehealth: Payer: Self-pay | Admitting: Allergy and Immunology

## 2020-06-14 NOTE — Telephone Encounter (Signed)
Called and left a message for patient to call the office back to discuss in detail the chili sauce reaction. I also her local [facility] to see if they had any Rhinocort in stock and I confirmed with the pharmacy technician Ysidro Evert that they had plenty in stock and he confirmed that if she came by toady before 8 pm he'd gladly assist her with retrieving the medication.

## 2020-06-14 NOTE — Telephone Encounter (Signed)
Patient called back and we discussed her going to [facility] to get the nasal spray. Patient also said that the chili sauce was consumed last night and she began to have small bumps around lips and right side of the face and neck after consumption. She didn't have any benadryl nor did she have any antihistamine. I advised her to go to [facility] and speak to Ysidro Evert to get assistance on retrieving the nasal spray as well as the antihistamine until she was notified of other instructions from our office. Patient verbally agreed  Please advise on the chili reaction and further testing. Thank you

## 2020-06-14 NOTE — Telephone Encounter (Signed)
Patient called to give an update. She says she cannot find Rhinocort anywhere. Every pharmacy she has gone to is sold out. She is still sneezing, but not like she was before. She said she also ate some chili sauce and had some itching afterwards. She is wondering if she needs to be tested for that.

## 2020-07-23 ENCOUNTER — Ambulatory Visit
Admission: RE | Admit: 2020-07-23 | Discharge: 2020-07-23 | Disposition: A | Discharge status: Home / Self Care | Payer: 59 | Source: Ambulatory Visit | Attending: Family Medicine | Admitting: Family Medicine

## 2020-07-23 ENCOUNTER — Other Ambulatory Visit: Payer: Self-pay

## 2020-07-23 DIAGNOSIS — Z0000 Encounter for general adult medical examination without abnormal findings: Secondary | ICD-10-CM

## 2020-08-30 ENCOUNTER — Other Ambulatory Visit: Payer: Self-pay | Admitting: Obstetrics and Gynecology

## 2020-10-18 ENCOUNTER — Other Ambulatory Visit: Payer: Self-pay

## 2020-10-18 ENCOUNTER — Encounter (HOSPITAL_BASED_OUTPATIENT_CLINIC_OR_DEPARTMENT_OTHER): Payer: Self-pay | Admitting: Obstetrics and Gynecology

## 2020-10-19 ENCOUNTER — Other Ambulatory Visit: Payer: Self-pay | Admitting: Obstetrics and Gynecology

## 2020-10-25 ENCOUNTER — Other Ambulatory Visit (HOSPITAL_COMMUNITY)
Admission: RE | Admit: 2020-10-25 | Discharge: 2020-10-25 | Disposition: A | Discharge status: Home / Self Care | Payer: 59 | Source: Ambulatory Visit | Attending: Obstetrics and Gynecology | Admitting: Obstetrics and Gynecology

## 2020-10-25 DIAGNOSIS — Z01812 Encounter for preprocedural laboratory examination: Secondary | ICD-10-CM | POA: Insufficient documentation

## 2020-10-25 DIAGNOSIS — Z20822 Contact with and (suspected) exposure to covid-19: Secondary | ICD-10-CM | POA: Insufficient documentation

## 2020-10-26 ENCOUNTER — Encounter (HOSPITAL_BASED_OUTPATIENT_CLINIC_OR_DEPARTMENT_OTHER)
Admission: RE | Admit: 2020-10-26 | Discharge: 2020-10-26 | Disposition: A | Discharge status: Home / Self Care | Payer: 59 | Source: Ambulatory Visit | Attending: Obstetrics and Gynecology | Admitting: Obstetrics and Gynecology

## 2020-10-26 ENCOUNTER — Other Ambulatory Visit: Payer: Self-pay | Admitting: Obstetrics and Gynecology

## 2020-10-26 DIAGNOSIS — Z01812 Encounter for preprocedural laboratory examination: Secondary | ICD-10-CM | POA: Insufficient documentation

## 2020-10-26 DIAGNOSIS — Z79899 Other long term (current) drug therapy: Secondary | ICD-10-CM | POA: Diagnosis not present

## 2020-10-26 DIAGNOSIS — Z28310 Unvaccinated for covid-19: Secondary | ICD-10-CM | POA: Diagnosis not present

## 2020-10-26 DIAGNOSIS — N898 Other specified noninflammatory disorders of vagina: Secondary | ICD-10-CM | POA: Diagnosis not present

## 2020-10-26 DIAGNOSIS — N920 Excessive and frequent menstruation with regular cycle: Secondary | ICD-10-CM | POA: Diagnosis not present

## 2020-10-26 DIAGNOSIS — N840 Polyp of corpus uteri: Secondary | ICD-10-CM | POA: Diagnosis not present

## 2020-10-26 DIAGNOSIS — D250 Submucous leiomyoma of uterus: Secondary | ICD-10-CM | POA: Diagnosis not present

## 2020-10-26 LAB — SARS CORONAVIRUS 2 (TAT 6-24 HRS): SARS Coronavirus 2: NEGATIVE

## 2020-10-26 LAB — CBC
HCT: 34.6 % — ABNORMAL LOW (ref 36.0–46.0)
Hemoglobin: 10.8 g/dL — ABNORMAL LOW (ref 12.0–15.0)
MCH: 26.3 pg (ref 26.0–34.0)
MCHC: 31.2 g/dL (ref 30.0–36.0)
MCV: 84.4 fL (ref 80.0–100.0)
Platelets: 332 10*3/uL (ref 150–400)
RBC: 4.1 MIL/uL (ref 3.87–5.11)
RDW: 17.3 % — ABNORMAL HIGH (ref 11.5–15.5)
WBC: 6.2 10*3/uL (ref 4.0–10.5)
nRBC: 0 % (ref 0.0–0.2)

## 2020-10-26 LAB — BASIC METABOLIC PANEL
Anion gap: 10 (ref 5–15)
BUN: 11 mg/dL (ref 6–20)
CO2: 18 mmol/L — ABNORMAL LOW (ref 22–32)
Calcium: 9.2 mg/dL (ref 8.9–10.3)
Chloride: 109 mmol/L (ref 98–111)
Creatinine, Ser: 0.97 mg/dL (ref 0.44–1.00)
GFR, Estimated: >60 mL/min (ref >60)
Glucose, Bld: 76 mg/dL (ref 70–99)
Potassium: 4.5 mmol/L (ref 3.5–5.1)
Sodium: 137 mmol/L (ref 135–145)

## 2020-10-26 LAB — POCT PREGNANCY, URINE: Preg Test, Ur: NEGATIVE

## 2020-10-26 NOTE — Progress Notes (Signed)
Messaged pt to remind to come to [facility] today for preop lab work.

## 2020-10-26 NOTE — H&P (Deleted)
  The note originally documented on this encounter has been moved the the encounter in which it belongs.  

## 2020-10-26 NOTE — H&P (Signed)
Reason for Appointment   1. Preop Visit/ Abnormal Uterine bleeding /Endometrial mass        History of Present Illness  Isolation Precautions:         Has patient received COVID-19 vaccination? No. Does patient report new onset of COVID symptoms? No. Has patient or close contact tested positive for COVID-19? No , not in the past 2 weeks.  General:          40 yo presents for pre-op visit.        Pt is scheduled for hysteroscopy/D&C w/ removal of endometrial mass via MyoSure on Oct 27, 2020.        H/o robotic-assisted laparoscopic myomectomy performed by Dr Kerin Perna in 2009 and Aug 2015 secondary to fibroids.        Pt seen Jul 12, 2020 c/o irregular menses that had been worsening for 1 yr. Endorsed random intermenstrual bleeding. Pelvic exam revealed yellow, mucoid discharge in vaginal vault. Gc/chlam/trich and wet mount negative. TSH WNL. HGB low at 10.1, indicating anemia. 325 mg ferrous sulfate daily recommended.        Pt seen Aug 05, 2020 and reported bleeding until Jul 28, 2020, though it had decreased. She reported changing her pad/tampon only once. Expressed interest in maintaining fertility.        U/S on Aug 05, 2020 revealed uterus measuring 14.2 x 9.0 x 10.4 cm. Endometrium measured 5.2 mm. 4 fibroids measured w/ the largest measuring 7.0 cm and located submucosal and midbody. All stable in size from prev U/S in Jan 2021. Endometrium distorted and irregular in appearance due to submucosal fibroid pushing into endometrial cavity. Bilat OV WNL.        [place] on Aug 30, 2020 revealed approx. 2.5 cm mass in endometrial cavity, either polyp or uterine fibroid. There was a submucosal fibroid, about 50% in cavity and another fundal submucosal fibroid about 25% in cavity.        EMB on Aug 30, 2020 benign. No malignancy identified.         Pt last seen Aug 30, 2020. Pelvic exam revealed 14 wk size uterus and moderate amt of blood in vaginal vault. Lysteda 650 MG prescribed for bleeding. She  expressed interest in [place] radiofrequency ablation, however insurance coverage for surgery was denied. She declined myomectomy or hysterectomy and desires endometrial mass removal w/ MyoSure.        Today, pt reports she is not bleeding in office.         Pelvic exam revealed 14 weeks size uterus and yellow discharge in vaginal vault.    Current Medications  Taking   Lysteda 650 MG Tablet 2 tablets Orally Three times daily, Notes: occas  Calcium + D3 600-200 MG-UNIT Tablet 1 tablet with a meal Orally twice a day, Notes: occas  Multivitamin Adult - Tablet 1 tablet Orally once a day, Notes: occas  Iron 325 (65 Fe) MG Tablet 1 tablet Orally Once a day, Notes: occas  Folic Acid . Tablet 1 tablet Orally Once a day, Notes: occas  Medication List reviewed and reconciled with the patient   Past Medical History  Uterine Fibroids by U/S > myomectomy x 2, 1 cyst Dr. Kerin Perna 02/20/2014.   GYN: Dr. Landry Mellow.   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   Anemia.    Surgical History  Fibroids / myomectomy robotic assisted laparoscopy myomectomy with Dr. Kerin Perna in 2009 2009  laproscopic myomectomy with Dr. Kerin Perna 02/20/14  colonoscopy 05/17/2019   Family History  Father: alive 89 yrs, diagnosed with Diabetes, Hypertension  Mother: alive 46 yrs, Thyroid, problems, Asthma  Sister 1: alive 54 yrs, A + W  1 sister(s) .   denies family h/o gyn cancers , No Family History of Colon Cancer, Polyps, or Liver Disease.   Social History  General:  Tobacco use cigarettes: Never smoked, Tobacco history last updated 10/12/2020, Vaping No. no EXPOSURE TO PASSIVE SMOKE. Alcohol: yes, wine 2-3 x yearly. no Caffeine. no Recreational drug use. DIET: vegetarian (for 13 days). no Exercise, NONE. Marital Status: single. Children: none. 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 Seat belt use: yes.    Gyn History  Sexual activity not  currently sexually active.  Periods : irregular bleeding.  LMP 09/26/20.  Denies H/O Birth control none.  Last pap smear date 06/17/2018 hpv neg.  Last mammogram date 07/24/2020 - normal.  Denies H/O Abnormal pap smear none.  H/O STD HSV 2.    OB History  Never been pregnant per patient.  Number of pregnancies 0.    Allergies  Pollen   Hospitalization/Major Diagnostic Procedure  [facility]   Review of Systems  CONSTITUTIONAL:  Chills No. Fatigue No. Fever No. Night sweats No. Recent travel outside [place] No. Sweats No. Weight change No.  OPHTHALMOLOGY:  Blurring of vision no. Change in vision no. Double vision no.  ENT:  Dizziness no. Nose bleeds no. Sore throat no. Teeth pain no.  ALLERGY:  Hives no.  CARDIOLOGY:  Chest pain no. High blood pressure no. Irregular heart beat no. Leg edema no. Palpitations no.  RESPIRATORY:  Shortness of breath no. Cough no. Wheezing no.  UROLOGY:  Pain with urination no. Urinary urgency no. Urinary frequency no. Urinary incontinence no. Difficulty urinating No. Blood in urine No.  GASTROENTEROLOGY:  Abdominal pain no. Appetite change no. Bloating/belching no. Blood in stool or on toilet paper no. Change in bowel movements no. Constipation no. Diarrhea no. Difficulty swallowing no. Nausea no.  FEMALE REPRODUCTIVE:  Vulvar pain no. Vulvar rash no. Abnormal vaginal bleeding yes. Breast pain no. Nipple discharge no. Pain with intercourse no. Pelvic pain no. Unusual vaginal discharge no. Vaginal itching no.  MUSCULOSKELETAL:  Muscle aches no.  NEUROLOGY:  Headache no. Tingling/numbness no. Weakness no.  PSYCHOLOGY:  Depression no. Anxiety no. Nervousness no. Sleep disturbances no. Suicidal ideation no .  ENDOCRINOLOGY:  Excessive thirst no. Excessive urination no. Hair loss no. Heat or cold intolerance no.  HEMATOLOGY/LYMPH:  Abnormal bleeding no. Easy bruising no. Swollen glands no.  DERMATOLOGY:   New/changing skin lesion no. Rash no. Sores no.  Negative except as stated in HPI.    Vital Signs  Wt 183.6, Wt change -1.4 lb, Ht 65, BMI 30.55, Pulse sitting 99, BP sitting 130/60.     Examination  General Examination:       CONSTITUTIONAL: alert, oriented, NAD . SKIN: moist, warm. EYES: Conjunctiva clear. LUNGS: good I:E efffort noted, CTA bilat. HEART: RRR. ABDOMEN: soft, non-tender/non-distended, bowel sounds present . FEMALE GENITOURINARY: normal external genitalia, labia - unremarkable, vagina - pink moist mucosa, no lesions, yellow discharge in vaginal vault, cervix - no discharge or lesions or CMT, adnexa - no masses or tenderness, uterus - nontender and 14 weeks size uterus. PSYCH: affect normal, good eye contact.       Physical Examination  Chaperone present:  Chaperone present  Rush,Kesah 10/12/2020 11:21:50 AM > , for pelvic exam.            Assessments     1. Menorrhagia with regular cycle - N92.0 (Primary)   2. Endometrial mass - N94.89   3. Fibroids, submucosal - D25.0   4. Vaginal discharge - N89.8     Treatment   1. Menorrhagia with regular cycle   Notes: Pt is scheduled for hysteroscopy/D&C w/ removal of endometrial mass via MyoSure on Oct 27, 2020. Pt is advised she will be able to return home the same day. Discussed risks of hysteroscopy including but not limited to infection, bleeding, possible perforation of the uterus, with the need for further surgery. Discussed risk of blood transfusion and risk of HIV or hep B&C with blood transfusion. Pt is aware of risks and desires blood transfusion if needed. Pt advised to avoid NSAIDs (Aspirin, Aleve, Advil, Ibuprofen, Motrin) from now until surgery given risk of bleeding during surgery. She may take Tylenol for pain management. She is advised to avoid eating or drinking starting midnight prior to surgery. Pt is advised that she may have watery discharge or cramping after surgery. Discussed post-surgery  avoidance of driving for 24 hours and avoidance of intercourse for 2 weeks after procedure. Follow up in 4 weeks for 2 wk post-op visit.      2. Endometrial mass   Notes: Pt is scheduled for hysteroscopy/D&C w/ removal of endometrial mass via MyoSure on Oct 27, 2020. Follow up in 4 weeks for 2 wk post-op visit.      3. Fibroids, submucosal   Notes: Pt is scheduled for hysteroscopy/D&C w/ removal of endometrial mass via MyoSure on Oct 27, 2020. Plan for removal of fibroid w/ MyoSure.      4. Vaginal discharge        LAB: Wet Mount Notes: Wet mount obtained. Will call pt w/ results or plan of care if warranted.          Procedures  Scribe Documentation:         Attestation:  I personally scribed for Dr. Landry Mellow on the date of this appointment. Electronically signed by scribe , Sandrea Hammond 10/12/2020 11:17:16 AM > .              Labs         Lab: Dorette Grate within normal         Value Reference Range       WBCS Slightly increased A Normal -         CLUE CELLS None seen  None seen -         TRICHOMONAS None Seen  None Seen -         YEAST None seen  None seen -                  Ariez Neilan 10/12/2020 03:07:32 PM > no bv/ yeast or trich

## 2020-10-27 ENCOUNTER — Ambulatory Visit (HOSPITAL_BASED_OUTPATIENT_CLINIC_OR_DEPARTMENT_OTHER): Payer: 59 | Admitting: Anesthesiology

## 2020-10-27 ENCOUNTER — Other Ambulatory Visit: Payer: Self-pay

## 2020-10-27 ENCOUNTER — Encounter (HOSPITAL_BASED_OUTPATIENT_CLINIC_OR_DEPARTMENT_OTHER): Payer: Self-pay | Admitting: Obstetrics and Gynecology

## 2020-10-27 ENCOUNTER — Ambulatory Visit (HOSPITAL_BASED_OUTPATIENT_CLINIC_OR_DEPARTMENT_OTHER)
Admission: RE | Admit: 2020-10-27 | Discharge: 2020-10-27 | Disposition: A | Discharge status: Home / Self Care | Payer: 59 | Attending: Obstetrics and Gynecology | Admitting: Obstetrics and Gynecology

## 2020-10-27 ENCOUNTER — Encounter (HOSPITAL_BASED_OUTPATIENT_CLINIC_OR_DEPARTMENT_OTHER)
Admission: RE | Disposition: A | Discharge status: Home / Self Care | Payer: Self-pay | Source: Home / Self Care | Attending: Obstetrics and Gynecology

## 2020-10-27 DIAGNOSIS — Z28310 Unvaccinated for covid-19: Secondary | ICD-10-CM | POA: Insufficient documentation

## 2020-10-27 DIAGNOSIS — D250 Submucous leiomyoma of uterus: Secondary | ICD-10-CM | POA: Insufficient documentation

## 2020-10-27 DIAGNOSIS — Z79899 Other long term (current) drug therapy: Secondary | ICD-10-CM | POA: Insufficient documentation

## 2020-10-27 DIAGNOSIS — N898 Other specified noninflammatory disorders of vagina: Secondary | ICD-10-CM | POA: Insufficient documentation

## 2020-10-27 DIAGNOSIS — N920 Excessive and frequent menstruation with regular cycle: Secondary | ICD-10-CM | POA: Insufficient documentation

## 2020-10-27 DIAGNOSIS — N840 Polyp of corpus uteri: Secondary | ICD-10-CM | POA: Insufficient documentation

## 2020-10-27 HISTORY — PX: HYSTEROSCOPY WITH D & C: SHX1775

## 2020-10-27 SURGERY — DILATATION AND CURETTAGE /HYSTEROSCOPY
Anesthesia: General | Site: Uterus

## 2020-10-27 MED ORDER — PROPOFOL 10 MG/ML IV BOLUS
INTRAVENOUS | Status: DC | PRN
  Administered 2020-10-27: 200 mg via INTRAVENOUS

## 2020-10-27 MED ORDER — ACETAMINOPHEN 500 MG PO TABS
1000.0000 mg | ORAL_TABLET | ORAL | Status: AC
  Administered 2020-10-27: 1000 mg via ORAL

## 2020-10-27 MED ORDER — PHENYLEPHRINE 40 MCG/ML (10ML) SYRINGE FOR IV PUSH (FOR BLOOD PRESSURE SUPPORT)
PREFILLED_SYRINGE | INTRAVENOUS | Status: AC
  Filled 2020-10-27: qty 10

## 2020-10-27 MED ORDER — EPHEDRINE 5 MG/ML INJ
INTRAVENOUS | Status: AC
  Filled 2020-10-27: qty 10

## 2020-10-27 MED ORDER — POVIDONE-IODINE 10 % EX SWAB
2.0000 "application " | Freq: Once | CUTANEOUS | Status: AC
  Administered 2020-10-27: 2 via TOPICAL

## 2020-10-27 MED ORDER — EPHEDRINE SULFATE 50 MG/ML IJ SOLN
INTRAMUSCULAR | Status: DC | PRN
  Administered 2020-10-27 (×2): 5 mg via INTRAVENOUS
  Administered 2020-10-27 (×2): 10 mg via INTRAVENOUS

## 2020-10-27 MED ORDER — LIDOCAINE HCL (CARDIAC) PF 100 MG/5ML IV SOSY
PREFILLED_SYRINGE | INTRAVENOUS | Status: DC | PRN
  Administered 2020-10-27: 60 mg via INTRAVENOUS

## 2020-10-27 MED ORDER — CELECOXIB 200 MG PO CAPS
400.0000 mg | ORAL_CAPSULE | ORAL | Status: AC
  Administered 2020-10-27: 400 mg via ORAL

## 2020-10-27 MED ORDER — VASOPRESSIN 20 UNIT/ML IV SOLN
INTRAVENOUS | Status: DC | PRN
  Administered 2020-10-27: 30 mL via INTRAMUSCULAR

## 2020-10-27 MED ORDER — DROPERIDOL 2.5 MG/ML IJ SOLN
INTRAMUSCULAR | Status: DC | PRN
  Administered 2020-10-27: .625 mg via INTRAVENOUS

## 2020-10-27 MED ORDER — OXYCODONE HCL 5 MG PO TABS: 5.0000 mg | ORAL_TABLET | Freq: Once | ORAL | Status: DC | PRN

## 2020-10-27 MED ORDER — SODIUM CHLORIDE 0.9 % IR SOLN
Status: DC | PRN
  Administered 2020-10-27: 3329 mL

## 2020-10-27 MED ORDER — FENTANYL CITRATE (PF) 100 MCG/2ML IJ SOLN: 25.0000 ug | INTRAMUSCULAR | Status: DC | PRN

## 2020-10-27 MED ORDER — ONDANSETRON HCL 4 MG/2ML IJ SOLN
INTRAMUSCULAR | Status: AC
  Filled 2020-10-27: qty 2

## 2020-10-27 MED ORDER — MIDAZOLAM HCL 5 MG/5ML IJ SOLN
INTRAMUSCULAR | Status: DC | PRN
  Administered 2020-10-27: 2 mg via INTRAVENOUS

## 2020-10-27 MED ORDER — SILVER NITRATE-POT NITRATE 75-25 % EX MISC
CUTANEOUS | Status: AC
  Filled 2020-10-27: qty 10

## 2020-10-27 MED ORDER — FENTANYL CITRATE (PF) 100 MCG/2ML IJ SOLN
INTRAMUSCULAR | Status: DC | PRN
  Administered 2020-10-27 (×2): 50 ug via INTRAVENOUS

## 2020-10-27 MED ORDER — CELECOXIB 200 MG PO CAPS
ORAL_CAPSULE | ORAL | Status: AC
  Filled 2020-10-27: qty 2

## 2020-10-27 MED ORDER — ONDANSETRON HCL 4 MG/2ML IJ SOLN
INTRAMUSCULAR | Status: DC | PRN
  Administered 2020-10-27: 4 mg via INTRAVENOUS

## 2020-10-27 MED ORDER — MIDAZOLAM HCL 2 MG/2ML IJ SOLN
INTRAMUSCULAR | Status: AC
  Filled 2020-10-27: qty 2

## 2020-10-27 MED ORDER — LACTATED RINGERS IV SOLN: INTRAVENOUS | Status: DC

## 2020-10-27 MED ORDER — DEXAMETHASONE SODIUM PHOSPHATE 10 MG/ML IJ SOLN
INTRAMUSCULAR | Status: AC
  Filled 2020-10-27: qty 1

## 2020-10-27 MED ORDER — OXYCODONE HCL 5 MG/5ML PO SOLN: 5.0000 mg | Freq: Once | ORAL | Status: DC | PRN

## 2020-10-27 MED ORDER — PROPOFOL 500 MG/50ML IV EMUL
INTRAVENOUS | Status: AC
  Filled 2020-10-27: qty 50

## 2020-10-27 MED ORDER — IBUPROFEN 800 MG PO TABS: 800.0000 mg | ORAL_TABLET | Freq: Three times a day (TID) | ORAL | 0 refills | Status: DC | PRN

## 2020-10-27 MED ORDER — BUPIVACAINE HCL (PF) 0.25 % IJ SOLN
INTRAMUSCULAR | Status: AC
  Filled 2020-10-27: qty 30

## 2020-10-27 MED ORDER — LIDOCAINE 2% (20 MG/ML) 5 ML SYRINGE
INTRAMUSCULAR | Status: AC
  Filled 2020-10-27: qty 5

## 2020-10-27 MED ORDER — VASOPRESSIN 20 UNIT/ML IV SOLN
INTRAVENOUS | Status: AC
  Filled 2020-10-27: qty 1

## 2020-10-27 MED ORDER — PROMETHAZINE HCL 25 MG/ML IJ SOLN: 6.2500 mg | INTRAMUSCULAR | Status: DC | PRN

## 2020-10-27 MED ORDER — FENTANYL CITRATE (PF) 100 MCG/2ML IJ SOLN
INTRAMUSCULAR | Status: AC
  Filled 2020-10-27: qty 2

## 2020-10-27 MED ORDER — BUPIVACAINE HCL (PF) 0.25 % IJ SOLN
INTRAMUSCULAR | Status: DC | PRN
  Administered 2020-10-27: 10 mL

## 2020-10-27 MED ORDER — DEXAMETHASONE SODIUM PHOSPHATE 4 MG/ML IJ SOLN
INTRAMUSCULAR | Status: DC | PRN
  Administered 2020-10-27: 10 mg via INTRAVENOUS

## 2020-10-27 MED ORDER — SUCCINYLCHOLINE CHLORIDE 200 MG/10ML IV SOSY
PREFILLED_SYRINGE | INTRAVENOUS | Status: AC
  Filled 2020-10-27: qty 10

## 2020-10-27 MED ORDER — ACETAMINOPHEN 500 MG PO TABS
ORAL_TABLET | ORAL | Status: AC
  Filled 2020-10-27: qty 2

## 2020-10-27 MED ORDER — OXYCODONE HCL 5 MG PO TABS: 5.0000 mg | ORAL_TABLET | ORAL | 0 refills | Status: AC | PRN

## 2020-10-27 MED ORDER — PHENYLEPHRINE HCL (PRESSORS) 10 MG/ML IV SOLN
INTRAVENOUS | Status: DC | PRN
  Administered 2020-10-27 (×2): 80 ug via INTRAVENOUS

## 2020-10-27 SURGICAL SUPPLY — 17 items
CATH ROBINSON RED A/P 16FR (CATHETERS) IMPLANT
DEVICE MYOSURE LITE (MISCELLANEOUS) IMPLANT
DEVICE MYOSURE REACH (MISCELLANEOUS) IMPLANT
DILATOR CANAL MILEX (MISCELLANEOUS) IMPLANT
GAUZE 4X4 16PLY RFD (DISPOSABLE) ×3 IMPLANT
GLOVE SURG ENC TEXT LTX SZ6.5 (GLOVE) ×3 IMPLANT
GLOVE SURG UNDER POLY LF SZ6.5 (GLOVE) ×3 IMPLANT
GLOVE SURG UNDER POLY LF SZ7 (GLOVE) ×3 IMPLANT
GOWN STRL REUS W/TWL LRG LVL3 (GOWN DISPOSABLE) ×6 IMPLANT
KIT PROCEDURE FLUENT (KITS) ×3 IMPLANT
PACK VAGINAL MINOR WOMEN LF (CUSTOM PROCEDURE TRAY) ×3 IMPLANT
PAD OB MATERNITY 4.3X12.25 (PERSONAL CARE ITEMS) ×3 IMPLANT
PAD PREP 24X48 CUFFED NSTRL (MISCELLANEOUS) ×3 IMPLANT
SEAL CERVICAL OMNI LOK (ABLATOR) ×2 IMPLANT
SEAL ROD LENS SCOPE MYOSURE (ABLATOR) ×3 IMPLANT
SLEEVE SCD COMPRESS KNEE MED (STOCKING) ×3 IMPLANT
TOWEL GREEN STERILE FF (TOWEL DISPOSABLE) ×3 IMPLANT

## 2020-10-27 NOTE — Transfer of Care (Signed)
Immediate Anesthesia Transfer of Care Note  Patient: ENOLIA KOEPKE  Procedure(s) Performed: DILATATION & CURETTAGE/HYSTEROSCOPY (N/A Uterus)  Patient Location: PACU  Anesthesia Type:General  Level of Consciousness: drowsy and patient cooperative  Airway & Oxygen Therapy: Patient Spontanous Breathing and Patient connected to face mask oxygen  Post-op Assessment: Report given to RN and Post -op Vital signs reviewed and stable  Post vital signs: Reviewed and stable  Last Vitals:  Vitals Value Taken Time  BP    Temp    Pulse 100 10/27/20 1344  Resp 17 10/27/20 1344  SpO2 100 % 10/27/20 1344  Vitals shown include unvalidated device data.  Last Pain:  Vitals:   10/27/20 1155  TempSrc: Oral  PainSc: 0-No pain         Complications: No complications documented.

## 2020-10-27 NOTE — Anesthesia Procedure Notes (Signed)
Procedure Name: LMA Insertion Date/Time: 10/27/2020 12:22 PM Performed by: Willa Frater, CRNA Pre-anesthesia Checklist: Patient identified, Emergency Drugs available, Suction available and Patient being monitored Patient Re-evaluated:Patient Re-evaluated prior to induction Oxygen Delivery Method: Circle system utilized Preoxygenation: Pre-oxygenation with 100% oxygen Induction Type: IV induction Ventilation: Mask ventilation without difficulty LMA: LMA inserted LMA Size: 4.0 Number of attempts: 1 Airway Equipment and Method: Bite block Placement Confirmation: positive ETCO2 Tube secured with: Tape Dental Injury: Teeth and Oropharynx as per pre-operative assessment

## 2020-10-27 NOTE — Anesthesia Postprocedure Evaluation (Signed)
Anesthesia Post Note  Patient: LEXIS POTENZA  Procedure(s) Performed: DILATATION & CURETTAGE/HYSTEROSCOPY (N/A Uterus)     Patient location during evaluation: PACU Anesthesia Type: General Level of consciousness: awake and alert Pain management: pain level controlled Vital Signs Assessment: post-procedure vital signs reviewed and stable Respiratory status: spontaneous breathing, nonlabored ventilation and respiratory function stable Cardiovascular status: blood pressure returned to baseline and stable Postop Assessment: no apparent nausea or vomiting Anesthetic complications: no   No complications documented.  Last Vitals:  Vitals:   10/27/20 1400 10/27/20 1418  BP: (!) 135/94 (!) 149/86  Pulse: 79 76  Resp: 14 16  Temp:  36.6 C  SpO2: 100% 100%    Last Pain:  Vitals:   10/27/20 1418  TempSrc:   PainSc: 0-No pain                 Audry Pili

## 2020-10-27 NOTE — Anesthesia Preprocedure Evaluation (Addendum)
Anesthesia Evaluation  Patient identified by MRN, date of birth, ID band Patient awake    Reviewed: Allergy & Precautions, NPO status , Patient's Chart, lab work & pertinent test results  History of Anesthesia Complications Negative for: history of anesthetic complications  Airway Mallampati: II  TM Distance: >3 FB Neck ROM: Full    Dental  (+) Dental Advisory Given, Teeth Intact   Pulmonary neg pulmonary ROS,    Pulmonary exam normal        Cardiovascular negative cardio ROS Normal cardiovascular exam     Neuro/Psych negative neurological ROS  negative psych ROS   GI/Hepatic negative GI ROS, Neg liver ROS,   Endo/Other  negative endocrine ROS  Renal/GU negative Renal ROS     Musculoskeletal negative musculoskeletal ROS (+)   Abdominal   Peds  Hematology  (+) anemia ,   Anesthesia Other Findings Covid test negative   Reproductive/Obstetrics  AUB                             Anesthesia Physical Anesthesia Plan  ASA: I  Anesthesia Plan: General   Post-op Pain Management:    Induction: Intravenous  PONV Risk Score and Plan: 3 and Treatment may vary due to age or medical condition, Ondansetron, Dexamethasone and Midazolam  Airway Management Planned: LMA  Additional Equipment: None  Intra-op Plan:   Post-operative Plan: Extubation in OR  Informed Consent: I have reviewed the patients History and Physical, chart, labs and discussed the procedure including the risks, benefits and alternatives for the proposed anesthesia with the patient or authorized representative who has indicated his/her understanding and acceptance.     Dental advisory given  Plan Discussed with: CRNA and Anesthesiologist  Anesthesia Plan Comments:        Anesthesia Quick Evaluation

## 2020-10-27 NOTE — Op Note (Addendum)
10/27/2020  1:48 PM  PATIENT:  [name]  40 y.o. female  PRE-OPERATIVE DIAGNOSIS:  Abnormal Uterine Bleeding (N93.9) Fibroids (D25.0) Endometrial Mass (N94.89)  POST-OPERATIVE DIAGNOSIS:  Abnormal Uterine Bleeding (N93.9)  PROCEDURE:  Procedure(s): DILATATION & CURETTAGE/HYSTEROSCOPY (N/A)  SURGEON:  Surgeon(s) and Role:    Christophe Louis, MD - Primary  PHYSICIAN ASSISTANT:   ASSISTANTS: none   ANESTHESIA:   general  EBL:  150 mL   BLOOD ADMINISTERED:none  DRAINS: none   LOCAL MEDICATIONS USED:  MARCAINE    and OTHER Vasopressin   SPECIMEN:  Source of Specimen:  endometrial curettings   DISPOSITION OF SPECIMEN:  PATHOLOGY  COUNTS:  YES  TOURNIQUET:  * No tourniquets in log *  DICTATION: .Dragon Dictation  PLAN OF CARE: Discharge to home after PACU  PATIENT DISPOSITION:  PACU - hemodynamically stable.   Delay start of Pharmacological VTE agent (>24hrs) due to surgical blood loss or risk of bleeding: not applicable  Findings: Normal external genitalia.Marland Kitchen normal vaginal mucosa... dilated cervical os  Noted 8 mm at start of procedure ... numerous blood clots in the endometrial canal along with a very large posterior fibroid.   Procedure: Patient was taken to the operating room #2 at [facility] where she was placed under general anesthesia. She was placed in the dorsal lithotomy position. She was prepped and draped in the usual sterile fashion. A speculum was placed into the vaginal vault. The anterior lip of the cervix was grasped with a single-tooth tenaculum. Quarter percent Marcaine was injected at the 4 and 8:00 positions of the cervix. The uterus  was then sounded to 10  cm. The appeared to be dilated to approximately 8 mm at the start of the procedure. No further dilation was performs. The XL myosure scope was inserted. Visualization was very poor due to combination of blood clots, poor distention of the endometrial cavity due to large fibroids and dilated cervical  os. The omni lock was used to attempt to create a better seal on the cervix . Saline was still escaping from the os. The omni lock was removed and a single tooth tenaculum was placed on the posterior aspect of the cervix.  The  Large posterior fibroid noted majority into the endometrial cavity at this point the saline deficit was approaching 2000. I was unable to see beyond the large posterior fibroid to visualize any other endometrial masses. A sharp curettage was performed. The hysteroscope was reinserted and no perforations were noted.  The single-tooth tenaculum was removed from the anterior lip of the cervix. excellent hemostasis was noted. The speculum was removed from the patient's vagina. She was awakened from anesthesia taken care  To the recovery  room awake and in stable condition. Sponge lap and needle counts were correct x2.  Saline deficit 1950

## 2020-10-27 NOTE — H&P (Signed)
Date of Initial H&P:10/26/2020 History reviewed, patient examined, no change in status, stable for surgery. 

## 2020-10-27 NOTE — Discharge Instructions (Signed)
  Post Anesthesia Home Care Instructions  Activity: Get plenty of rest for the remainder of the day. A responsible individual must stay with you for 24 hours following the procedure.  For the next 24 hours, DO NOT: -Drive a car -Paediatric nurse -Drink alcoholic beverages -Take any medication unless instructed by your physician -Make any legal decisions or sign important papers.  Meals: Start with liquid foods such as gelatin or soup. Progress to regular foods as tolerated. Avoid greasy, spicy, heavy foods. If nausea and/or vomiting occur, drink only clear liquids until the nausea and/or vomiting subsides. Call your physician if vomiting continues.  Special Instructions/Symptoms: Your throat may feel dry or sore from the anesthesia or the breathing tube placed in your throat during surgery. If this causes discomfort, gargle with warm salt water. The discomfort should disappear within 24 hours.  If you had a scopolamine patch placed behind your ear for the management of post- operative nausea and/or vomiting:  1. The medication in the patch is effective for 72 hours, after which it should be removed.  Wrap patch in a tissue and discard in the trash. Wash hands thoroughly with soap and water. 2. You may remove the patch earlier than 72 hours if you experience unpleasant side effects which may include dry mouth, dizziness or visual disturbances. 3. Avoid touching the patch. Wash your hands with soap and water after contact with the patch.      Next dose of NSAID (Ibuprofen, Aleve, Motrin) can be given after 6:00PM. Next dose of Tylenol can be given after 6:00PM.

## 2020-10-28 ENCOUNTER — Encounter (HOSPITAL_BASED_OUTPATIENT_CLINIC_OR_DEPARTMENT_OTHER): Payer: Self-pay | Admitting: Obstetrics and Gynecology

## 2020-10-28 LAB — SURGICAL PATHOLOGY

## 2021-01-28 ENCOUNTER — Ambulatory Visit (HOSPITAL_BASED_OUTPATIENT_CLINIC_OR_DEPARTMENT_OTHER): Admit: 2021-01-28 | Payer: 59 | Admitting: Obstetrics and Gynecology

## 2021-01-28 ENCOUNTER — Encounter (HOSPITAL_BASED_OUTPATIENT_CLINIC_OR_DEPARTMENT_OTHER): Payer: Self-pay

## 2021-01-28 SURGERY — DILATATION & CURETTAGE/HYSTEROSCOPY WITH MYOSURE
Anesthesia: General

## 2021-03-21 ENCOUNTER — Other Ambulatory Visit: Payer: Self-pay | Admitting: Obstetrics and Gynecology

## 2021-03-21 DIAGNOSIS — N920 Excessive and frequent menstruation with regular cycle: Secondary | ICD-10-CM

## 2021-04-12 ENCOUNTER — Other Ambulatory Visit: Payer: Self-pay

## 2021-04-12 ENCOUNTER — Ambulatory Visit
Admission: RE | Admit: 2021-04-12 | Discharge: 2021-04-12 | Disposition: A | Discharge status: Home / Self Care | Payer: 59 | Source: Ambulatory Visit | Attending: Obstetrics and Gynecology | Admitting: Obstetrics and Gynecology

## 2021-04-12 DIAGNOSIS — N920 Excessive and frequent menstruation with regular cycle: Secondary | ICD-10-CM

## 2021-04-12 MED ORDER — GADOBENATE DIMEGLUMINE 529 MG/ML IV SOLN
19.0000 mL | Freq: Once | INTRAVENOUS | Status: AC | PRN
  Administered 2021-04-12: 19 mL via INTRAVENOUS

## 2021-07-01 ENCOUNTER — Other Ambulatory Visit: Payer: Self-pay | Admitting: Family Medicine

## 2021-07-01 DIAGNOSIS — Z1231 Encounter for screening mammogram for malignant neoplasm of breast: Secondary | ICD-10-CM

## 2021-07-11 ENCOUNTER — Other Ambulatory Visit: Payer: Self-pay | Admitting: Obstetrics and Gynecology

## 2021-07-20 ENCOUNTER — Ambulatory Visit
Admission: RE | Admit: 2021-07-20 | Discharge: 2021-07-20 | Disposition: A | Discharge status: Home / Self Care | Payer: 59 | Source: Ambulatory Visit

## 2021-07-20 ENCOUNTER — Other Ambulatory Visit: Payer: Self-pay | Admitting: Family Medicine

## 2021-07-20 DIAGNOSIS — Z1231 Encounter for screening mammogram for malignant neoplasm of breast: Secondary | ICD-10-CM

## 2021-07-25 ENCOUNTER — Encounter (HOSPITAL_BASED_OUTPATIENT_CLINIC_OR_DEPARTMENT_OTHER): Payer: Self-pay | Admitting: Obstetrics and Gynecology

## 2021-07-25 ENCOUNTER — Other Ambulatory Visit: Payer: Self-pay

## 2021-07-25 NOTE — Progress Notes (Signed)
Spoke w/ via phone for pre-op interview---pt Lab needs dos---- urine pregnancy POCT per anesthesia              Lab results------Lab appt on 07/26/21 for BMP, CBC, & type and screen COVID test -----patient states asymptomatic no test needed Arrive at -------1100 on 07/28/21 NPO after MN NO Solid Food.  Clear liquids from MN until---1000 Med rec completed Medications to take morning of surgery -----none Diabetic medication -----n/a Patient instructed no nail polish to be worn day of surgery Patient instructed to bring photo id and insurance card day of surgery Patient aware to have Driver (ride ) / caregiver    for 24 hours after surgery - one of her parents Patient Special Instructions -----Extended stay instructions given. Pre-Op special Istructions -----none Patient verbalized understanding of instructions that were given at this phone interview. Patient denies shortness of breath, chest pain, fever, cough at this phone interview.

## 2021-07-25 NOTE — Progress Notes (Signed)
PLEASE WEAR A MASK OUT IN PUBLIC AND SOCIAL DISTANCE AND [place] YOUR HANDS FREQUENTLY. PLEASE ASK ALL YOUR CLOSE HOUSEHOLD CONTACT TO WEAR MASK OUT IN PUBLIC AND SOCIAL DISTANCE AND [place] HANDS FREQUENTLY ALSO.      Your procedure is scheduled on Thursday, 07/28/21/  Report to [place] AT  11:00 A. M.   Call this number if you have problems the morning of surgery  [phone].   OUR ADDRESS IS [address].  WE ARE LOCATED IN [facility].  PLEASE BRING YOUR INSURANCE CARD AND PHOTO ID DAY OF SURGERY.  ONLY ONE PERSON ALLOWED IN FACILITY WAITING AREA.                                     REMEMBER:  DO NOT EAT FOOD, CANDY GUM OR MINTS  AFTER MIDNIGHT THE NIGHT BEFORE YOUR SURGERY . YOU MAY HAVE CLEAR LIQUIDS FROM MIDNIGHT THE NIGHT BEFORE YOUR SURGERY UNTIL 10:00 am. NO CLEAR LIQUIDS AFTER   10:00 am DAY OF SURGERY.   YOU MAY  BRUSH YOUR TEETH MORNING OF SURGERY AND RINSE YOUR MOUTH OUT, NO CHEWING GUM CANDY OR MINTS.    CLEAR LIQUID DIET   Foods Allowed                                                                     Foods Excluded  Coffee and tea, regular and decaf                             liquids that you cannot  Plain Jell-O any favor except red or purple                                           see through such as: Fruit ices (not with fruit pulp)                                     milk, soups, orange juice  Iced Popsicles                                    All solid food Carbonated beverages, regular and diet                                    Cranberry, grape and apple juices Sports drinks like Gatorade  Sample Menu Breakfast                                Lunch  Supper Cranberry juice                                           Jell-O                                     Grape juice                           Apple juice Coffee or tea                        Jell-O                                       Popsicle                                                Coffee or tea                        Coffee or tea  _____________________________________________________________________     TAKE THESE MEDICATIONS MORNING OF SURGERY WITH A SIP OF WATER:  NONE  ONE VISITOR IS ALLOWED IN WAITING ROOM ONLY DAY OF SURGERY.  YOU MAY HAVE ANOTHER PERSON SWITCH OUT WITH THE  1  VISITOR IN THE WAITING ROOM DAY OF SURGERY AND A MASK MUST BE WORN IN THE WAITING ROOM.    2 VISITORS  MAY VISIT IN THE EXTENDED RECOVERY ROOM UNTIL 800 PM ONLY 1 VISITOR AGE 28 AND OVER MAY SPEND THE NIGHT AND MUST BE IN EXTENDED RECOVERY ROOM NO LATER THAN 800 PM .    UP TO 2 CHILDREN AGE 76 TO 15 MAY ALSO VISIT IN EXTENDED RECOV ERY ROOM ONLY UNTIL 800 PM AND MUST LEAVE BY 800 PM.   ALL PERSONS VISITING IN EXTENDED RECOVERY ROOM MUST WEAR A MASK.                                    DO NOT WEAR JEWERLY, MAKE UP. DO NOT WEAR LOTIONS, POWDERS, PERFUMES OR NAIL POLISH ON YOUR FINGERNAILS. TOENAIL POLISH IS OK TO WEAR. DO NOT SHAVE FOR 48 HOURS PRIOR TO DAY OF SURGERY. MEN MAY SHAVE FACE AND NECK. CONTACTS, GLASSES, OR DENTURES MAY NOT BE WORN TO SURGERY.                                    [facility] IS NOT RESPONSIBLE  FOR ANY BELONGINGS.                                                                    Marland Kitchen  ________________________________________________________________________                                                        QUESTIONS [place] Falling PRE OP NURSE PHONE [phone].

## 2021-07-26 ENCOUNTER — Encounter (HOSPITAL_COMMUNITY)
Admission: RE | Admit: 2021-07-26 | Discharge: 2021-07-26 | Disposition: A | Discharge status: Home / Self Care | Payer: 59 | Source: Ambulatory Visit | Attending: Obstetrics and Gynecology | Admitting: Obstetrics and Gynecology

## 2021-07-26 ENCOUNTER — Other Ambulatory Visit: Payer: Self-pay | Admitting: Obstetrics and Gynecology

## 2021-07-26 DIAGNOSIS — Z01812 Encounter for preprocedural laboratory examination: Secondary | ICD-10-CM | POA: Insufficient documentation

## 2021-07-26 DIAGNOSIS — D259 Leiomyoma of uterus, unspecified: Secondary | ICD-10-CM

## 2021-07-26 DIAGNOSIS — D500 Iron deficiency anemia secondary to blood loss (chronic): Secondary | ICD-10-CM

## 2021-07-26 LAB — CBC
HCT: 32.6 % — ABNORMAL LOW (ref 36.0–46.0)
Hemoglobin: 9.9 g/dL — ABNORMAL LOW (ref 12.0–15.0)
MCH: 24.9 pg — ABNORMAL LOW (ref 26.0–34.0)
MCHC: 30.4 g/dL (ref 30.0–36.0)
MCV: 81.9 fL (ref 80.0–100.0)
Platelets: 379 10*3/uL (ref 150–400)
RBC: 3.98 MIL/uL (ref 3.87–5.11)
RDW: 15.5 % (ref 11.5–15.5)
WBC: 6.1 10*3/uL (ref 4.0–10.5)
nRBC: 0 % (ref 0.0–0.2)

## 2021-07-26 LAB — BASIC METABOLIC PANEL
Anion gap: 7 (ref 5–15)
BUN: 9 mg/dL (ref 6–20)
CO2: 23 mmol/L (ref 22–32)
Calcium: 8.9 mg/dL (ref 8.9–10.3)
Chloride: 107 mmol/L (ref 98–111)
Creatinine, Ser: 0.74 mg/dL (ref 0.44–1.00)
GFR, Estimated: >60 mL/min (ref >60)
Glucose, Bld: 123 mg/dL — ABNORMAL HIGH (ref 70–99)
Potassium: 3.9 mmol/L (ref 3.5–5.1)
Sodium: 137 mmol/L (ref 135–145)

## 2021-07-26 NOTE — Progress Notes (Addendum)
Left message with shinelle pinder at dr Ronita Hipps office hemaglobin 9.9 at pre op labs today.  Spoke with  dr Annye Asa mda and made aware pt hemaglobin 9.9 with pre op labs 07/26/2021, pt ok for [facility] surgery on 07-28-2021 per dr Rogelio Seen Glennon Mac mda

## 2021-07-27 ENCOUNTER — Other Ambulatory Visit: Payer: Self-pay | Admitting: Obstetrics and Gynecology

## 2021-07-27 ENCOUNTER — Ambulatory Visit
Admission: RE | Admit: 2021-07-27 | Discharge: 2021-07-27 | Disposition: A | Discharge status: Home / Self Care | Payer: 59 | Source: Ambulatory Visit

## 2021-07-27 ENCOUNTER — Observation Stay: Admission: RE | Admit: 2021-07-27 | Payer: 59 | Source: Ambulatory Visit | Admitting: Obstetrics and Gynecology

## 2021-07-27 DIAGNOSIS — Z1231 Encounter for screening mammogram for malignant neoplasm of breast: Secondary | ICD-10-CM

## 2021-07-28 ENCOUNTER — Encounter (HOSPITAL_BASED_OUTPATIENT_CLINIC_OR_DEPARTMENT_OTHER)
Admission: RE | Disposition: A | Discharge status: Home / Self Care | Payer: Self-pay | Source: Home / Self Care | Attending: Obstetrics and Gynecology

## 2021-07-28 ENCOUNTER — Ambulatory Visit (HOSPITAL_BASED_OUTPATIENT_CLINIC_OR_DEPARTMENT_OTHER): Payer: 59 | Admitting: Anesthesiology

## 2021-07-28 ENCOUNTER — Encounter (HOSPITAL_BASED_OUTPATIENT_CLINIC_OR_DEPARTMENT_OTHER): Payer: Self-pay | Admitting: Obstetrics and Gynecology

## 2021-07-28 ENCOUNTER — Other Ambulatory Visit: Payer: Self-pay

## 2021-07-28 ENCOUNTER — Ambulatory Visit (HOSPITAL_BASED_OUTPATIENT_CLINIC_OR_DEPARTMENT_OTHER)
Admission: RE | Admit: 2021-07-28 | Discharge: 2021-07-29 | Disposition: A | Discharge status: Home / Self Care | Payer: 59 | Attending: Obstetrics and Gynecology | Admitting: Obstetrics and Gynecology

## 2021-07-28 DIAGNOSIS — K660 Peritoneal adhesions (postprocedural) (postinfection): Secondary | ICD-10-CM | POA: Insufficient documentation

## 2021-07-28 DIAGNOSIS — D509 Iron deficiency anemia, unspecified: Secondary | ICD-10-CM | POA: Insufficient documentation

## 2021-07-28 DIAGNOSIS — D219 Benign neoplasm of connective and other soft tissue, unspecified: Secondary | ICD-10-CM | POA: Diagnosis present

## 2021-07-28 DIAGNOSIS — N920 Excessive and frequent menstruation with regular cycle: Secondary | ICD-10-CM | POA: Insufficient documentation

## 2021-07-28 DIAGNOSIS — D259 Leiomyoma of uterus, unspecified: Secondary | ICD-10-CM | POA: Diagnosis present

## 2021-07-28 HISTORY — PX: MYOMECTOMY: SHX85

## 2021-07-28 HISTORY — DX: Benign neoplasm of connective and other soft tissue, unspecified: D21.9

## 2021-07-28 LAB — POCT PREGNANCY, URINE: Preg Test, Ur: NEGATIVE

## 2021-07-28 LAB — PREPARE RBC (CROSSMATCH)

## 2021-07-28 SURGERY — MYOMECTOMY, ABDOMINAL APPROACH
Anesthesia: General | Site: Abdomen

## 2021-07-28 MED ORDER — ACETAMINOPHEN 500 MG PO TABS
ORAL_TABLET | ORAL | Status: AC
  Filled 2021-07-28: qty 2

## 2021-07-28 MED ORDER — HYDROMORPHONE HCL 2 MG/ML IJ SOLN
INTRAMUSCULAR | Status: AC
  Filled 2021-07-28: qty 1

## 2021-07-28 MED ORDER — CEFAZOLIN SODIUM-DEXTROSE 2-4 GM/100ML-% IV SOLN
2.0000 g | INTRAVENOUS | Status: AC
  Administered 2021-07-28: 2 g via INTRAVENOUS

## 2021-07-28 MED ORDER — ONDANSETRON HCL 4 MG/2ML IJ SOLN
INTRAMUSCULAR | Status: AC
  Filled 2021-07-28: qty 2

## 2021-07-28 MED ORDER — ACETAMINOPHEN 500 MG PO TABS
1000.0000 mg | ORAL_TABLET | Freq: Once | ORAL | Status: AC
  Administered 2021-07-28: 1000 mg via ORAL

## 2021-07-28 MED ORDER — LACTATED RINGERS IV SOLN: INTRAVENOUS | Status: DC

## 2021-07-28 MED ORDER — ROCURONIUM BROMIDE 100 MG/10ML IV SOLN
INTRAVENOUS | Status: DC | PRN
  Administered 2021-07-28: 50 mg via INTRAVENOUS
  Administered 2021-07-28: 10 mg via INTRAVENOUS
  Administered 2021-07-28: 5 mg via INTRAVENOUS

## 2021-07-28 MED ORDER — SUGAMMADEX SODIUM 500 MG/5ML IV SOLN
INTRAVENOUS | Status: DC | PRN
  Administered 2021-07-28: 200 mg via INTRAVENOUS

## 2021-07-28 MED ORDER — FENTANYL CITRATE (PF) 100 MCG/2ML IJ SOLN
INTRAMUSCULAR | Status: DC | PRN
  Administered 2021-07-28: 100 ug via INTRAVENOUS
  Administered 2021-07-28: 50 ug via INTRAVENOUS
  Administered 2021-07-28: 100 ug via INTRAVENOUS

## 2021-07-28 MED ORDER — ROCURONIUM BROMIDE 10 MG/ML (PF) SYRINGE
PREFILLED_SYRINGE | INTRAVENOUS | Status: AC
  Filled 2021-07-28: qty 10

## 2021-07-28 MED ORDER — OXYCODONE HCL 5 MG PO TABS: 5.0000 mg | ORAL_TABLET | ORAL | Status: DC | PRN

## 2021-07-28 MED ORDER — MIDAZOLAM HCL 2 MG/2ML IJ SOLN
INTRAMUSCULAR | Status: AC
  Filled 2021-07-28: qty 2

## 2021-07-28 MED ORDER — DEXAMETHASONE SODIUM PHOSPHATE 10 MG/ML IJ SOLN
INTRAMUSCULAR | Status: DC | PRN
Start: 2021-07-28 — End: 2021-07-28
  Administered 2021-07-28: 10 mg via INTRAVENOUS

## 2021-07-28 MED ORDER — ACETAMINOPHEN 500 MG PO TABS
1000.0000 mg | ORAL_TABLET | Freq: Four times a day (QID) | ORAL | Status: DC
  Administered 2021-07-28 – 2021-07-29 (×3): 1000 mg via ORAL

## 2021-07-28 MED ORDER — CEFAZOLIN SODIUM-DEXTROSE 2-4 GM/100ML-% IV SOLN
INTRAVENOUS | Status: AC
  Filled 2021-07-28: qty 100

## 2021-07-28 MED ORDER — 0.9 % SODIUM CHLORIDE (POUR BTL) OPTIME
TOPICAL | Status: DC | PRN
Start: 2021-07-28 — End: 2021-07-28
  Administered 2021-07-28: 500 mL

## 2021-07-28 MED ORDER — TRANEXAMIC ACID 1000 MG/10ML IV SOLN: 1000.0000 mg | Freq: Once | INTRAVENOUS | Status: DC

## 2021-07-28 MED ORDER — PROPOFOL 10 MG/ML IV BOLUS
INTRAVENOUS | Status: DC | PRN
  Administered 2021-07-28 (×2): 40 mg via INTRAVENOUS
  Administered 2021-07-28: 200 mg via INTRAVENOUS

## 2021-07-28 MED ORDER — HYDROMORPHONE HCL 1 MG/ML IJ SOLN: 0.2500 mg | INTRAMUSCULAR | Status: DC | PRN

## 2021-07-28 MED ORDER — DEXAMETHASONE SODIUM PHOSPHATE 10 MG/ML IJ SOLN
INTRAMUSCULAR | Status: AC
  Filled 2021-07-28: qty 1

## 2021-07-28 MED ORDER — DIPHENHYDRAMINE HCL 25 MG PO CAPS: 25.0000 mg | ORAL_CAPSULE | Freq: Four times a day (QID) | ORAL | Status: DC | PRN

## 2021-07-28 MED ORDER — KETOROLAC TROMETHAMINE 30 MG/ML IJ SOLN
30.0000 mg | Freq: Four times a day (QID) | INTRAMUSCULAR | Status: DC
  Administered 2021-07-28 – 2021-07-29 (×3): 30 mg via INTRAVENOUS

## 2021-07-28 MED ORDER — VASOPRESSIN 20 UNIT/ML IV SOLN
INTRAVENOUS | Status: DC | PRN
  Administered 2021-07-28: 23 mL via INTRAMUSCULAR

## 2021-07-28 MED ORDER — ACETAMINOPHEN 325 MG PO TABS: 650.0000 mg | ORAL_TABLET | Freq: Four times a day (QID) | ORAL | Status: DC | PRN

## 2021-07-28 MED ORDER — BUPIVACAINE HCL (PF) 0.25 % IJ SOLN
INTRAMUSCULAR | Status: DC | PRN
  Administered 2021-07-28: 10 mL

## 2021-07-28 MED ORDER — TRANEXAMIC ACID-NACL 1000-0.7 MG/100ML-% IV SOLN
INTRAVENOUS | Status: AC
  Filled 2021-07-28: qty 100

## 2021-07-28 MED ORDER — LIDOCAINE HCL (CARDIAC) PF 100 MG/5ML IV SOSY
PREFILLED_SYRINGE | INTRAVENOUS | Status: DC | PRN
  Administered 2021-07-28: 50 mg via INTRAVENOUS

## 2021-07-28 MED ORDER — LIDOCAINE HCL (PF) 2 % IJ SOLN
INTRAMUSCULAR | Status: AC
  Filled 2021-07-28: qty 5

## 2021-07-28 MED ORDER — TRANEXAMIC ACID-NACL 1000-0.7 MG/100ML-% IV SOLN
1000.0000 mg | Freq: Once | INTRAVENOUS | Status: AC
  Administered 2021-07-28: 1000 mg via INTRAVENOUS

## 2021-07-28 MED ORDER — HYDROMORPHONE HCL 1 MG/ML IJ SOLN
INTRAMUSCULAR | Status: DC | PRN
  Administered 2021-07-28: 1 mg via INTRAVENOUS

## 2021-07-28 MED ORDER — MIDAZOLAM HCL 5 MG/5ML IJ SOLN
INTRAMUSCULAR | Status: DC | PRN
  Administered 2021-07-28: 2 mg via INTRAVENOUS

## 2021-07-28 MED ORDER — IBUPROFEN 200 MG PO TABS: 600.0000 mg | ORAL_TABLET | Freq: Four times a day (QID) | ORAL | Status: DC

## 2021-07-28 MED ORDER — GLYCOPYRROLATE 0.2 MG/ML IJ SOLN
INTRAMUSCULAR | Status: DC | PRN
  Administered 2021-07-28: .1 mg via INTRAVENOUS

## 2021-07-28 MED ORDER — BUPIVACAINE LIPOSOME 1.3 % IJ SUSP
INTRAMUSCULAR | Status: DC | PRN
  Administered 2021-07-28: 20 mL

## 2021-07-28 MED ORDER — ONDANSETRON HCL 4 MG/2ML IJ SOLN
INTRAMUSCULAR | Status: DC | PRN
  Administered 2021-07-28: 4 mg via INTRAVENOUS

## 2021-07-28 MED ORDER — IBUPROFEN 800 MG PO TABS: 800.0000 mg | ORAL_TABLET | Freq: Three times a day (TID) | ORAL | Status: DC | PRN

## 2021-07-28 MED ORDER — FENTANYL CITRATE (PF) 250 MCG/5ML IJ SOLN
INTRAMUSCULAR | Status: AC
  Filled 2021-07-28: qty 5

## 2021-07-28 MED ORDER — KETOROLAC TROMETHAMINE 30 MG/ML IJ SOLN
INTRAMUSCULAR | Status: AC
  Filled 2021-07-28: qty 1

## 2021-07-28 MED ORDER — POVIDONE-IODINE 10 % EX SWAB: 2.0000 "application " | Freq: Once | CUTANEOUS | Status: DC

## 2021-07-28 MED ORDER — SODIUM CHLORIDE 0.9 % IV SOLN: 10.0000 mL/h | Freq: Once | INTRAVENOUS | Status: AC

## 2021-07-28 SURGICAL SUPPLY — 54 items
ADH SKN CLS APL DERMABOND .7 (GAUZE/BANDAGES/DRESSINGS) ×1
BARRIER ADHS 3X4 INTERCEED (GAUZE/BANDAGES/DRESSINGS) ×2 IMPLANT
BRR ADH 4X3 ABS CNTRL BYND (GAUZE/BANDAGES/DRESSINGS) ×2
CATH FOLEY 2WAY  3CC  8FR (CATHETERS)
CATH FOLEY 2WAY 3CC 8FR (CATHETERS) IMPLANT
DECANTER SPIKE VIAL GLASS SM (MISCELLANEOUS) ×5 IMPLANT
DERMABOND ADVANCED (GAUZE/BANDAGES/DRESSINGS) ×1
DERMABOND ADVANCED .7 DNX12 (GAUZE/BANDAGES/DRESSINGS) IMPLANT
DRAPE CESAREAN BIRTH W POUCH (DRAPES) ×3 IMPLANT
DRSG OPSITE POSTOP 4X10 (GAUZE/BANDAGES/DRESSINGS) ×1 IMPLANT
ELECT NDL TIP 2.8 STRL (NEEDLE) IMPLANT
ELECT NEEDLE TIP 2.8 STRL (NEEDLE) ×2 IMPLANT
GAUZE 4X4 16PLY ~~LOC~~+RFID DBL (SPONGE) ×3 IMPLANT
GLOVE SURG ENC MOIS LTX SZ7.5 (GLOVE) ×3 IMPLANT
GLOVE SURG UNDER POLY LF SZ7 (GLOVE) ×3 IMPLANT
GOWN STRL REUS W/TWL LRG LVL3 (GOWN DISPOSABLE) ×3 IMPLANT
IV STOPCOCK 4 WAY 40  W/Y SET (IV SOLUTION)
IV STOPCOCK 4 WAY 40 W/Y SET (IV SOLUTION) IMPLANT
KIT TURNOVER CYSTO (KITS) ×3 IMPLANT
NDL SAFETY ECLIPSE 18X1.5 (NEEDLE) IMPLANT
NEEDLE HYPO 18GX1.5 SHARP (NEEDLE) ×2
NEEDLE HYPO 22GX1.5 SAFETY (NEEDLE) ×6 IMPLANT
NS IRRIG 1000ML POUR BTL (IV SOLUTION) ×5 IMPLANT
PACK ABDOMINAL GYN (CUSTOM PROCEDURE TRAY) ×3 IMPLANT
PAD ARMBOARD 7.5X6 YLW CONV (MISCELLANEOUS) ×3 IMPLANT
PAD OB MATERNITY 4.3X12.25 (PERSONAL CARE ITEMS) ×3 IMPLANT
SHEET LAVH (DRAPES) ×3 IMPLANT
SPONGE T-LAP 18X18 ~~LOC~~+RFID (SPONGE) ×3 IMPLANT
SUT DVC VLOC 180 0 12IN GS21 (SUTURE) ×4
SUT DVC VLOC 180 2-0 12IN GS21 (SUTURE) ×4
SUT MNCRL AB 3-0 PS2 27 (SUTURE) ×4 IMPLANT
SUT MON AB 2-0 CT1 36 (SUTURE) ×3 IMPLANT
SUT MON AB-0 CT1 36 (SUTURE) ×6 IMPLANT
SUT PLAIN 2 0 (SUTURE) ×2
SUT PLAIN ABS 2-0 CT1 27XMFL (SUTURE) ×2 IMPLANT
SUT STRATAFIX SPIRAL PDS+ 0 30 (SUTURE) ×1 IMPLANT
SUT VIC AB 0 CT1 18XCR BRD8 (SUTURE) ×2 IMPLANT
SUT VIC AB 0 CT1 8-18 (SUTURE) ×2
SUT VIC AB 2-0 CT1 27 (SUTURE) ×2
SUT VIC AB 2-0 CT1 TAPERPNT 27 (SUTURE) ×2 IMPLANT
SUT VIC AB 2-0 SH 27 (SUTURE)
SUT VIC AB 2-0 SH 27XBRD (SUTURE) IMPLANT
SUT VIC AB 4-0 SH 27 (SUTURE)
SUT VIC AB 4-0 SH 27XANBCTRL (SUTURE) IMPLANT
SUT VLOC 180 0 9IN  GS21 (SUTURE) ×2
SUT VLOC 180 0 9IN GS21 (SUTURE) IMPLANT
SUT VLOC 180 3-0 9IN GS21 (SUTURE) ×2 IMPLANT
SUTURE DVC VL 180 2-0 12INGS21 (SUTURE) IMPLANT
SUTURE DVC VLC 180 0 12IN GS21 (SUTURE) IMPLANT
SYR 50ML LL SCALE MARK (SYRINGE) IMPLANT
SYR 5ML LL (SYRINGE) ×1 IMPLANT
SYR CONTROL 10ML LL (SYRINGE) ×6 IMPLANT
TOWEL OR 17X26 10 PK STRL BLUE (TOWEL DISPOSABLE) ×6 IMPLANT
TRAY FOLEY W/BAG SLVR 14FR LF (SET/KITS/TRAYS/PACK) ×3 IMPLANT

## 2021-07-28 NOTE — Anesthesia Preprocedure Evaluation (Addendum)
Anesthesia Evaluation  Patient identified by MRN, date of birth, ID band Patient awake    Reviewed: Allergy & Precautions, H&P , NPO status , Patient's Chart, lab work & pertinent test results  Airway Mallampati: II  TM Distance: >3 FB Neck ROM: Full    Dental no notable dental hx. (+) Teeth Intact, Dental Advisory Given   Pulmonary neg pulmonary ROS,    Pulmonary exam normal breath sounds clear to auscultation       Cardiovascular negative cardio ROS   Rhythm:Regular Rate:Normal     Neuro/Psych negative neurological ROS  negative psych ROS   GI/Hepatic negative GI ROS, Neg liver ROS,   Endo/Other  negative endocrine ROS  Renal/GU negative Renal ROS  negative genitourinary   Musculoskeletal   Abdominal   Peds  Hematology  (+) Blood dyscrasia, anemia ,   Anesthesia Other Findings   Reproductive/Obstetrics negative OB ROS                            Anesthesia Physical Anesthesia Plan  ASA: 2  Anesthesia Plan: General   Post-op Pain Management: Tylenol PO (pre-op)   Induction: Intravenous  PONV Risk Score and Plan: 4 or greater and Ondansetron, Dexamethasone and Midazolam  Airway Management Planned: Oral ETT  Additional Equipment:   Intra-op Plan:   Post-operative Plan: Extubation in OR  Informed Consent: I have reviewed the patients History and Physical, chart, labs and discussed the procedure including the risks, benefits and alternatives for the proposed anesthesia with the patient or authorized representative who has indicated his/her understanding and acceptance.     Dental advisory given  Plan Discussed with: CRNA  Anesthesia Plan Comments:         Anesthesia Quick Evaluation

## 2021-07-28 NOTE — Transfer of Care (Signed)
Immediate Anesthesia Transfer of Care Note  Patient: [name]  Procedure(s) Performed: ABDOMINAL MYOMECTOMY (Abdomen)  Patient Location: PACU  Anesthesia Type:General  Level of Consciousness: awake and patient cooperative  Airway & Oxygen Therapy: Patient Spontanous Breathing and Patient connected to nasal cannula oxygen  Post-op Assessment: Report given to RN and Post -op Vital signs reviewed and stable  Post vital signs: Reviewed and stable  Last Vitals:  Vitals Value Taken Time  BP 138/84 07/28/21 1508  Temp 36.8 C 07/28/21 1508  Pulse 74 07/28/21 1508  Resp 15 07/28/21 1510  SpO2 100 % 07/28/21 1508  Vitals shown include unvalidated device data.  Last Pain:  Vitals:   07/28/21 1154  PainSc: 0-No pain      Patients Stated Pain Goal: 9 (16/10/96 0454)  Complications: No notable events documented.

## 2021-07-28 NOTE — H&P (Signed)
[name] is an 41 y.o. female. Menorrhagia, secondary anemia for myomectomy.  Pertinent Gynecological History: Menses: flow is moderate Bleeding: dysfunctional uterine bleeding Contraception: none DES exposure: denies Blood transfusions: none Sexually transmitted diseases: no past history Previous GYN Procedures:  myomectomy x 2   Last mammogram: normal Date: 2022 Last pap: normal Date: 2022 OB History: G0, P0   Menstrual History: Menarche age: 3 Patient's last menstrual period was 07/09/2021.    Past Medical History:  Diagnosis Date   Anemia    due to fibroids per pt   Fibroids    uterine   Urticaria     Past Surgical History:  Procedure Laterality Date   HYSTEROSCOPY WITH D & C N/A 10/27/2020   Procedure: DILATATION & CURETTAGE/HYSTEROSCOPY;  Surgeon: Christophe Louis, MD;  Location: [facility];  Service: Gynecology;  Laterality: N/A;   ROBOT ASSISTED MYOMECTOMY  2009   ROBOT ASSISTED MYOMECTOMY N/A 02/20/2014   Procedure: ROBOTIC ASSISTED MYOMECTOMY;  Surgeon: Governor Specking, MD;  Location: [facility];  Service: Gynecology;  Laterality: N/A;   ROBOTIC ASSISTED LAPAROSCOPIC LYSIS OF ADHESION N/A 02/20/2014   Procedure: ROBOTIC ASSISTED LAPAROSCOPIC LYSIS OF ADHESION,EXCISION OF ENDOMETRIOSIS;  Surgeon: Governor Specking, MD;  Location: [facility];  Service: Gynecology;  Laterality: N/A;   ROBOTIC ASSISTED LAPAROSCOPIC OVARIAN CYSTECTOMY Right 02/20/2014   Procedure: ROBOTIC ASSISTED LAPAROSCOPIC OVARIAN CYSTECTOMY;  Surgeon: Governor Specking, MD;  Location: [facility];  Service: Gynecology;  Laterality: Right;    Family History  Problem Relation Age of Onset   Asthma Mother     Social History:  reports that she has never smoked. She has never used smokeless tobacco. She reports current alcohol use. She reports that she does not use drugs.  Allergies:  Allergies  Allergen Reactions   Pollen Extract     Pollen, grass, etc. / environmental allergy causes  sneezing.    No medications prior to admission.    Review of Systems  Constitutional: Negative.   All other systems reviewed and are negative.  Height 5\' 4"  (1.626 m), weight 84.4 kg, last menstrual period 07/09/2021. Physical Exam  No results found for this or any previous visit (from the past 24 hour(s)).  MM 3D SCREEN BREAST BILATERAL  Result Date: 07/27/2021 CLINICAL DATA:  Screening. EXAM: DIGITAL SCREENING BILATERAL MAMMOGRAM WITH TOMOSYNTHESIS AND CAD TECHNIQUE: Bilateral screening digital craniocaudal and mediolateral oblique mammograms were obtained. Bilateral screening digital breast tomosynthesis was performed. The images were evaluated with computer-aided detection. COMPARISON:  Previous exam(s). ACR Breast Density Category c: The breast tissue is heterogeneously dense, which may obscure small masses. FINDINGS: There are no findings suspicious for malignancy. IMPRESSION: No mammographic evidence of malignancy. A result letter of this screening mammogram will be mailed directly to the patient. RECOMMENDATION: Screening mammogram in one year. (Code:SM-B-01Y) BI-RADS CATEGORY  1: Negative. Electronically Signed   By: Ileana Roup M.D.   On: 07/27/2021 13:00    Assessment/Plan: Symptomatic fibroids Anemia Proceed with abdominal myomectomy. Risks of infection , bleeding , Injury to surrounding organs with need for repair discussed. Likely need for blood products with inherent risks noted.Possible need for hysterectomuy discussed.   [name] 07/28/2021, 6:49 AM

## 2021-07-28 NOTE — Op Note (Signed)
07/28/2021  3:02 PM  PATIENT:  [name]  40 y.o. female  PRE-OPERATIVE DIAGNOSIS:  Symptomatic Fibroids Menorrhagia with secondary anemia  POST-OPERATIVE DIAGNOSIS:  Symptomatic Fibroids  PROCEDURE:  Procedure(s): ABDOMINAL MYOMECTOMY-MULTIPLE LYSIS OF ADHESIONS  SURGEON:  Surgeon(s): Brien Few, MD  ASSISTANTS: LAW, MD   ANESTHESIA:   local and general  ESTIMATED BLOOD LOSS: 500 mL   DRAINS: Urinary Catheter (Foley)   LOCAL MEDICATIONS USED:  MARCAINE    and Amount: 30 ml  SPECIMEN:  Source of Specimen:  9 FIBROIDS, 270GMS  DISPOSITION OF SPECIMEN:  PATHOLOGY  COUNTS:  YES  DICTATION #: [id number]  PLAN OF CARE: 23 HR OBS  PATIENT DISPOSITION:  PACU - hemodynamically stable.  INTRAOPERATIVE TRANSFUSION  2UPRBC

## 2021-07-28 NOTE — Anesthesia Postprocedure Evaluation (Signed)
Anesthesia Post Note  Patient: [name]  Procedure(s) Performed: ABDOMINAL MYOMECTOMY (Abdomen)     Patient location during evaluation: PACU Anesthesia Type: General Level of consciousness: awake and alert Pain management: pain level controlled Vital Signs Assessment: post-procedure vital signs reviewed and stable Respiratory status: spontaneous breathing, nonlabored ventilation and respiratory function stable Cardiovascular status: stable and blood pressure returned to baseline Anesthetic complications: no   No notable events documented.  Last Vitals:  Vitals:   07/28/21 1530 07/28/21 1545  BP: 138/74 (!) 141/78  Pulse: 69 67  Resp: 14 13  Temp:    SpO2: 100% 100%    Last Pain:  Vitals:   07/28/21 1545  PainSc: [name]

## 2021-07-28 NOTE — Anesthesia Procedure Notes (Signed)
Procedure Name: Intubation Date/Time: 07/28/2021 1:04 PM Performed by: Jonna Munro, CRNA Pre-anesthesia Checklist: Patient identified, Emergency Drugs available, Suction available, Patient being monitored and Timeout performed Patient Re-evaluated:Patient Re-evaluated prior to induction Oxygen Delivery Method: Circle system utilized Preoxygenation: Pre-oxygenation with 100% oxygen Induction Type: IV induction Ventilation: Mask ventilation without difficulty Laryngoscope Size: Mac and 3 Grade View: Grade I Tube type: Oral Tube size: 7.0 mm Number of attempts: 1 Airway Equipment and Method: Stylet Placement Confirmation: ETT inserted through vocal cords under direct vision, positive ETCO2 and breath sounds checked- equal and bilateral Secured at: 22 cm Tube secured with: Tape Dental Injury: Teeth and Oropharynx as per pre-operative assessment

## 2021-07-28 NOTE — Progress Notes (Addendum)
Pt saturated peripad. Several clots noted w/ one clot approximate size of a quarter.  Pt able to ambulate 75-100 feet w/ 2 assists and tolerated well w/o c/o dizziness.  300cc urine from foley bag.  Will monitor closely. Will keep IV fluids at 125cc/hr and foley in until ambulates again.

## 2021-07-29 DIAGNOSIS — D259 Leiomyoma of uterus, unspecified: Secondary | ICD-10-CM | POA: Diagnosis not present

## 2021-07-29 LAB — BASIC METABOLIC PANEL
Anion gap: 7 (ref 5–15)
BUN: 10 mg/dL (ref 6–20)
CO2: 21 mmol/L — ABNORMAL LOW (ref 22–32)
Calcium: 8.7 mg/dL — ABNORMAL LOW (ref 8.9–10.3)
Chloride: 106 mmol/L (ref 98–111)
Creatinine, Ser: 0.85 mg/dL (ref 0.44–1.00)
GFR, Estimated: >60 mL/min (ref >60)
Glucose, Bld: 158 mg/dL — ABNORMAL HIGH (ref 70–99)
Potassium: 4 mmol/L (ref 3.5–5.1)
Sodium: 134 mmol/L — ABNORMAL LOW (ref 135–145)

## 2021-07-29 LAB — CBC
HCT: 32.1 % — ABNORMAL LOW (ref 36.0–46.0)
Hemoglobin: 10.2 g/dL — ABNORMAL LOW (ref 12.0–15.0)
MCH: 25.6 pg — ABNORMAL LOW (ref 26.0–34.0)
MCHC: 31.8 g/dL (ref 30.0–36.0)
MCV: 80.5 fL (ref 80.0–100.0)
Platelets: 282 10*3/uL (ref 150–400)
RBC: 3.99 MIL/uL (ref 3.87–5.11)
RDW: 15.1 % (ref 11.5–15.5)
WBC: 12 10*3/uL — ABNORMAL HIGH (ref 4.0–10.5)
nRBC: 0 % (ref 0.0–0.2)

## 2021-07-29 LAB — TYPE AND SCREEN
ABO/RH(D): O POS
Antibody Screen: NEGATIVE
Unit division: 0
Unit division: 0

## 2021-07-29 LAB — BPAM RBC
Blood Product Expiration Date: 202302222359
Blood Product Expiration Date: 202302222359
ISSUE DATE / TIME: 202301261341
ISSUE DATE / TIME: 202301261341
Unit Type and Rh: 5100
Unit Type and Rh: 5100

## 2021-07-29 MED ORDER — OXYCODONE HCL 5 MG PO TABS
5.0000 mg | ORAL_TABLET | ORAL | 0 refills | Status: DC | PRN
Start: 2021-07-29 — End: 2022-07-06

## 2021-07-29 MED ORDER — ACETAMINOPHEN 500 MG PO TABS
ORAL_TABLET | ORAL | Status: AC
  Filled 2021-07-29: qty 2

## 2021-07-29 MED ORDER — OXYCODONE-ACETAMINOPHEN 5-325 MG PO TABS: 1.0000 | ORAL_TABLET | ORAL | 0 refills | Status: DC | PRN

## 2021-07-29 MED ORDER — KETOROLAC TROMETHAMINE 30 MG/ML IJ SOLN
INTRAMUSCULAR | Status: AC
  Filled 2021-07-29: qty 1

## 2021-07-29 NOTE — Op Note (Signed)
NAMECARLYON, [name] MEDICAL RECORD NO: [id number] ACCOUNT NO: [id number] DATE OF BIRTH: 04/29/1981 FACILITY: [facility] LOCATION: [facility] PHYSICIAN: Lovenia Kim, MD  Operative Report   DATE OF PROCEDURE: 07/28/2021  PREOPERATIVE DIAGNOSES:  Symptomatic fibroids with secondary menorrhagia and secondary iron deficiency anemia.  POSTOPERATIVE DIAGNOSES:  Symptomatic fibroids with secondary menorrhagia and secondary iron deficiency anemia plus anterior abdominal wall adhesions.  PROCEDURE:  Exploratory laparotomy with multiple abdominal myomectomy and lysis of anterior abdominal wall adhesions.  SURGEON:  Lovenia Kim, MD.  ASSISTANT:  Dr. Lanny Cramp.  ESTIMATED BLOOD LOSS:  076 mL  COMPLICATIONS:  None.  DRAINS:  Foley.  COUNTS:  Correct.  DISPOSITION:  The patient to recovery in good condition.   INTRAOPERATIVE TRANSFUSION: 2 units packed red blood cells due to preoperative hemoglobin of 9.9.  DESCRIPTION OF PROCEDURE:  After being apprised of the risks of anesthesia, infection, bleeding, injury to surrounding organs, possible need for repair, delayed versus immediate complications to include bowel and bladder injury, possible need for repair,  possible infectious risks related to blood transfusion, the patient was brought to the operating room and was administered general anesthetic without complications, prepped and draped in usual sterile fashion.  Foley catheter placed.  Exam under  anesthesia revealed an 18-20 week size uterus, which was mobile.  No adnexal masses were appreciated.  Posterior lower fibroids appreciated on exam.  At this time, a Pfannenstiel skin incision was made with a scalpel and carried down to the fascia, which  was nicked in the midline and opened transversely using Mayo scissors.  Rectus muscles dissected sharply in the midline.  Peritoneum entered sharply.  Linea alba dissected sharply up to the level of the umbilicus.  Uterus was slightly adherent  to the  anterior peritoneal cavity, which was lysed using sharp lysis of adhesions.  At this time, a towel clip was placed to rotate and deliver the uterus from the abdominal cavity.  Multiple fibroids were identified as noted on the MRI. The largest fibroid of  9 cm noted to be in the fundal area.  Therefore, dilute Pitressin solution was placed, 20 mL in 100 dilution and then using electrocautery, a vertical incision was made at the fundal area.  Three fibroids were removed using sharp and blunt dissection  through this incision.  Additional incisions were made along the back wall, one just cephalad to the cervix, one just lateral to the uterine artery in the posterior wall and one in the anterior lower uterine segment. There was also a left pedunculated  fibroid discrete from the left tube.  All areas were infiltrated with Pitressin.  Electrocautery was used and blunt and sharp dissection was used to remove these additional fibroids.  Large intramural fibroid enters cavity. 3cm SM fibroid noted in cavity and removed with blunt and sharp dissections. All defects were closed with the 0 V-Loc suture and a baseball suture  is placed using a 2-0 V-Loc suture in each of the incisions.  Good hemostasis was noted.  Interceed was placed on all incisions.  Uterus was replaced in the abdominal cavity.  Normal tubes and normal ovaries were appreciated.  Irrigation had been  accomplished. At this time, peritoneum was closed using a 2-0 Monocryl in continuous running fashion.  Fascia closed using 0 Monocryl in continuous running fashion.  Subcutaneous tissue reapproximated using 2-0 plain.  Skin was closed using a 3-0  Monocryl in a continuous running fashion.  Dilute Marcaine-Exparel solution was placed.  The patient tolerated the  procedure well and was transferred to recovery in good condition.     PAA D: 07/28/2021 3:08:22 pm T: 07/29/2021 9:13:00 am  JOB: [id number]/ [id number]

## 2021-07-29 NOTE — Progress Notes (Signed)
1 Day Post-Op Procedure(s) (LRB): ABDOMINAL MYOMECTOMY (N/A)  Subjective: Patient reports nausea, incisional pain, tolerating PO, + flatus, + BM, and no problems voiding.    Objective: BP 128/75 (BP Location: Left Arm)    Pulse 72    Temp 98.5 F (36.9 C)    Resp 18    Ht 5\' 4"  (1.626 m)    Wt 87.6 kg    LMP 07/09/2021 Comment: per pt   SpO2 98%    BMI 33.15 kg/m   CBC    Component Value Date/Time   WBC 12.0 (H) 07/29/2021 0212   RBC 3.99 07/29/2021 0212   HGB 10.2 (L) 07/29/2021 0212   HCT 32.1 (L) 07/29/2021 0212   PLT 282 07/29/2021 0212   MCV 80.5 07/29/2021 0212   MCH 25.6 (L) 07/29/2021 0212   MCHC 31.8 07/29/2021 0212   RDW 15.1 07/29/2021 0212   LYMPHSABS 1.9 08/10/2016 0106   MONOABS 0.3 08/10/2016 0106   EOSABS 0.1 08/10/2016 0106   BASOSABS 0.0 08/10/2016 0106    I have reviewed patient's vital signs, intake and output, and labs.  General: alert, cooperative, and appears stated age Resp: clear to auscultation bilaterally and normal percussion bilaterally Cardio: regular rate and rhythm, S1, S2 normal, no murmur, click, rub or gallop GI: soft, non-tender; bowel sounds normal; no masses,  no organomegaly and incision: clean, dry, and intact Extremities: extremities normal, atraumatic, no cyanosis or edema Vaginal Bleeding: minimal  Assessment: s/p Procedure(s): ABDOMINAL MYOMECTOMY (N/A): stable, progressing well, and tolerating diet  Plan: Advance diet Encourage ambulation Advance to PO medication Discontinue IV fluids Discharge home  LOS: 0 days  FU office 2w  [name] J 07/29/2021, 6:30 AM

## 2021-07-30 LAB — SURGICAL PATHOLOGY

## 2021-08-01 ENCOUNTER — Encounter (HOSPITAL_BASED_OUTPATIENT_CLINIC_OR_DEPARTMENT_OTHER): Payer: Self-pay | Admitting: Obstetrics and Gynecology

## 2021-08-11 ENCOUNTER — Other Ambulatory Visit: Payer: Self-pay | Admitting: Obstetrics and Gynecology

## 2021-08-11 DIAGNOSIS — D649 Anemia, unspecified: Secondary | ICD-10-CM

## 2021-08-16 ENCOUNTER — Other Ambulatory Visit: Payer: Self-pay

## 2021-08-16 ENCOUNTER — Non-Acute Institutional Stay (HOSPITAL_COMMUNITY)
Admission: RE | Admit: 2021-08-16 | Discharge: 2021-08-16 | Disposition: A | Discharge status: Home / Self Care | Payer: 59 | Source: Ambulatory Visit | Attending: Internal Medicine | Admitting: Internal Medicine

## 2021-08-16 DIAGNOSIS — D649 Anemia, unspecified: Secondary | ICD-10-CM | POA: Insufficient documentation

## 2021-08-16 MED ORDER — SODIUM CHLORIDE 0.9 % IV SOLN: INTRAVENOUS | Status: DC | PRN

## 2021-08-16 MED ORDER — ACETAMINOPHEN 500 MG PO TABS
500.0000 mg | ORAL_TABLET | ORAL | Status: DC
  Administered 2021-08-16: 500 mg via ORAL
  Filled 2021-08-16: qty 1

## 2021-08-16 MED ORDER — SODIUM CHLORIDE 0.9 % IV SOLN
500.0000 mg | INTRAVENOUS | Status: DC
  Administered 2021-08-16: 500 mg via INTRAVENOUS
  Filled 2021-08-16: qty 25

## 2021-08-16 MED ORDER — DIPHENHYDRAMINE HCL 25 MG PO CAPS
25.0000 mg | ORAL_CAPSULE | ORAL | Status: DC
  Administered 2021-08-16: 25 mg via ORAL
  Filled 2021-08-16: qty 1

## 2021-08-16 NOTE — Progress Notes (Signed)
PATIENT CARE CENTER NOTE:  Provider: Brien Few MD  Diagnosis: iron deficiency anemia   Procedure: Venofer 500 mg   Patient received Venofer ( dose #1 of 2) via PIV . Pt premedicated with Tylenol and Benadryl PO as ordered. Tolerated well, vitals stable, discharge instructions given , verbalized understanding. Pt instructed to schedule next infusion in 2 weeks at front desk prior to leaving, verbalized understanding. Patient alert, oriented, and ambulatory at the time of discharge.

## 2021-08-30 ENCOUNTER — Non-Acute Institutional Stay (HOSPITAL_COMMUNITY)
Admission: RE | Admit: 2021-08-30 | Discharge: 2021-08-30 | Disposition: A | Discharge status: Home / Self Care | Payer: 59 | Source: Ambulatory Visit | Attending: Internal Medicine | Admitting: Internal Medicine

## 2021-08-30 ENCOUNTER — Other Ambulatory Visit: Payer: Self-pay

## 2021-08-30 DIAGNOSIS — D649 Anemia, unspecified: Secondary | ICD-10-CM | POA: Diagnosis not present

## 2021-08-30 MED ORDER — ACETAMINOPHEN 500 MG PO TABS
500.0000 mg | ORAL_TABLET | Freq: Once | ORAL | Status: AC
  Administered 2021-08-30: 500 mg via ORAL
  Filled 2021-08-30: qty 1

## 2021-08-30 MED ORDER — DIPHENHYDRAMINE HCL 25 MG PO CAPS
25.0000 mg | ORAL_CAPSULE | Freq: Once | ORAL | Status: AC
  Administered 2021-08-30: 25 mg via ORAL
  Filled 2021-08-30: qty 1

## 2021-08-30 MED ORDER — SODIUM CHLORIDE 0.9 % IV SOLN: INTRAVENOUS | Status: DC | PRN

## 2021-08-30 MED ORDER — SODIUM CHLORIDE 0.9 % IV SOLN
500.0000 mg | Freq: Once | INTRAVENOUS | Status: AC
  Administered 2021-08-30: 500 mg via INTRAVENOUS
  Filled 2021-08-30: qty 25

## 2021-08-30 NOTE — Progress Notes (Signed)
PATIENT CARE CENTER NOTE     Diagnosis: Anemia, unspecified type (D64.9)   Provider: Brien Few, MD   Procedure: Venofer infusion    Note:  Patient received Venofer infusion (dose # 2 of 2) via PIV. Pre medications given per order (Tylenol and Benadryl). Patient tolerated infusion well with no adverse reaction. Vital signs stable. AVS offered but patient refused. Patient alert, oriented and ambulatory at discharge.

## 2022-07-06 ENCOUNTER — Ambulatory Visit (INDEPENDENT_AMBULATORY_CARE_PROVIDER_SITE_OTHER): Payer: 59

## 2022-07-06 ENCOUNTER — Ambulatory Visit: Payer: 59 | Admitting: Podiatry

## 2022-07-06 ENCOUNTER — Encounter: Payer: Self-pay | Admitting: Podiatry

## 2022-07-06 VITALS — BP 130/80 | HR 81

## 2022-07-06 DIAGNOSIS — M7751 Other enthesopathy of right foot: Secondary | ICD-10-CM | POA: Diagnosis not present

## 2022-07-06 DIAGNOSIS — M9261 Juvenile osteochondrosis of tarsus, right ankle: Secondary | ICD-10-CM | POA: Diagnosis not present

## 2022-07-06 DIAGNOSIS — M7750 Other enthesopathy of unspecified foot: Secondary | ICD-10-CM

## 2022-07-06 DIAGNOSIS — M9262 Juvenile osteochondrosis of tarsus, left ankle: Secondary | ICD-10-CM | POA: Diagnosis not present

## 2022-07-06 DIAGNOSIS — M7661 Achilles tendinitis, right leg: Secondary | ICD-10-CM

## 2022-07-06 DIAGNOSIS — M7752 Other enthesopathy of left foot: Secondary | ICD-10-CM

## 2022-07-06 DIAGNOSIS — M7662 Achilles tendinitis, left leg: Secondary | ICD-10-CM

## 2022-07-06 MED ORDER — DICLOFENAC SODIUM 75 MG PO TBEC: 75.0000 mg | DELAYED_RELEASE_TABLET | Freq: Two times a day (BID) | ORAL | 1 refills | Status: DC

## 2022-07-06 MED ORDER — METHYLPREDNISOLONE 4 MG PO TBPK: ORAL_TABLET | ORAL | 0 refills | Status: DC

## 2022-07-06 NOTE — Patient Instructions (Signed)
Look for urea 40% cream or ointment and apply to the thickened dry skin / calluses. This can be bought over the counter, at a pharmacy or online such as [employer].   Achilles Tendinitis  with Rehab Achilles tendinitis is a disorder of the Achilles tendon. The Achilles tendon connects the large calf muscles (Gastrocnemius and Soleus) to the heel bone (calcaneus). This tendon is sometimes called the heel cord. It is important for pushing-off and standing on your toes and is important for walking, running, or jumping. Tendinitis is often caused by overuse and repetitive microtrauma. SYMPTOMS Pain, tenderness, swelling, warmth, and redness may occur over the Achilles tendon even at rest. Pain with pushing off, or flexing or extending the ankle. Pain that is worsened after or during activity. CAUSES  Overuse sometimes seen with rapid increase in exercise programs or in sports requiring running and jumping. Poor physical conditioning (strength and flexibility or endurance). Running sports, especially training running down hills. Inadequate warm-up before practice or play or failure to stretch before participation. Injury to the tendon. PREVENTION  Warm up and stretch before practice or competition. Allow time for adequate rest and recovery between practices and competition. Keep up conditioning. Keep up ankle and leg flexibility. Improve or keep muscle strength and endurance. Improve cardiovascular fitness. Use proper technique. Use proper equipment (shoes, skates). To help prevent recurrence, taping, protective strapping, or an adhesive bandage may be recommended for several weeks after healing is complete. PROGNOSIS  Recovery may take weeks to several months to heal. Longer recovery is expected if symptoms have been prolonged. Recovery is usually quicker if the inflammation is due to a direct blow as compared with overuse or sudden strain. RELATED COMPLICATIONS  Healing time will be prolonged  if the condition is not correctly treated. The injury must be given plenty of time to heal. Symptoms can reoccur if activity is resumed too soon. Untreated, tendinitis may increase the risk of tendon rupture requiring additional time for recovery and possibly surgery. TREATMENT  The first treatment consists of rest anti-inflammatory medication, and ice to relieve the pain. Stretching and strengthening exercises after resolution of pain will likely help reduce the risk of recurrence. Referral to a physical therapist or athletic trainer for further evaluation and treatment may be helpful. A walking boot or cast may be recommended to rest the Achilles tendon. This can help break the cycle of inflammation and microtrauma. Arch supports (orthotics) may be prescribed or recommended by your caregiver as an adjunct to therapy and rest. Surgery to remove the inflamed tendon lining or degenerated tendon tissue is rarely necessary and has shown less than predictable results. MEDICATION  Nonsteroidal anti-inflammatory medications, such as aspirin and ibuprofen, may be used for pain and inflammation relief. Do not take within 7 days before surgery. Take these as directed by your caregiver. Contact your caregiver immediately if any bleeding, stomach upset, or signs of allergic reaction occur. Other minor pain relievers, such as acetaminophen, may also be used. Pain relievers may be prescribed as necessary by your caregiver. Do not take prescription pain medication for longer than 4 to 7 days. Use only as directed and only as much as you need. Cortisone injections are rarely indicated. Cortisone injections may weaken tendons and predispose to rupture. It is better to give the condition more time to heal than to use them. HEAT AND COLD Cold is used to relieve pain and reduce inflammation for acute and chronic Achilles tendinitis. Cold should be applied for 10 to 15  minutes every 2 to 3 hours for inflammation and pain  and immediately after any activity that aggravates your symptoms. Use ice packs or an ice massage. Heat may be used before performing stretching and strengthening activities prescribed by your caregiver. Use a heat pack or a warm soak. SEEK MEDICAL CARE IF: Symptoms get worse or do not improve in 2 weeks despite treatment. New, unexplained symptoms develop. Drugs used in treatment may produce side effects.  EXERCISES:  RANGE OF MOTION (ROM) AND STRETCHING EXERCISES - Achilles Tendinitis  These exercises may help you when beginning to rehabilitate your injury. Your symptoms may resolve with or without further involvement from your physician, physical therapist or athletic trainer. While completing these exercises, remember:  Restoring tissue flexibility helps normal motion to return to the joints. This allows healthier, less painful movement and activity. An effective stretch should be held for at least 30 seconds. A stretch should never be painful. You should only feel a gentle lengthening or release in the stretched tissue.  STRETCH  Gastroc, Standing  Place hands on wall. Extend right / left leg, keeping the front knee somewhat bent. Slightly point your toes inward on your back foot. Keeping your right / left heel on the floor and your knee straight, shift your weight toward the wall, not allowing your back to arch. You should feel a gentle stretch in the right / left calf. Hold this position for 10 seconds. Repeat 3 times. Complete this stretch 2 times per day.  STRETCH  Soleus, Standing  Place hands on wall. Extend right / left leg, keeping the other knee somewhat bent. Slightly point your toes inward on your back foot. Keep your right / left heel on the floor, bend your back knee, and slightly shift your weight over the back leg so that you feel a gentle stretch deep in your back calf. Hold this position for 10 seconds. Repeat 3 times. Complete this stretch 2 times per  day.  STRETCH  Gastrocsoleus, Standing  Note: This exercise can place a lot of stress on your foot and ankle. Please complete this exercise only if specifically instructed by your caregiver.  Place the ball of your right / left foot on a step, keeping your other foot firmly on the same step. Hold on to the wall or a rail for balance. Slowly lift your other foot, allowing your body weight to press your heel down over the edge of the step. You should feel a stretch in your right / left calf. Hold this position for 10 seconds. Repeat this exercise with a slight bend in your knee. Repeat 3 times. Complete this stretch 2 times per day.   STRENGTHENING EXERCISES - Achilles Tendinitis These exercises may help you when beginning to rehabilitate your injury. They may resolve your symptoms with or without further involvement from your physician, physical therapist or athletic trainer. While completing these exercises, remember:  Muscles can gain both the endurance and the strength needed for everyday activities through controlled exercises. Complete these exercises as instructed by your physician, physical therapist or athletic trainer. Progress the resistance and repetitions only as guided. You may experience muscle soreness or fatigue, but the pain or discomfort you are trying to eliminate should never worsen during these exercises. If this pain does worsen, stop and make certain you are following the directions exactly. If the pain is still present after adjustments, discontinue the exercise until you can discuss the trouble with your clinician.  STRENGTH - Plantar-flexors  Sit with your right / left leg extended. Holding onto both ends of a rubber exercise band/tubing, loop it around the ball of your foot. Keep a slight tension in the band. Slowly push your toes away from you, pointing them downward. Hold this position for 10 seconds. Return slowly, controlling the tension in the band/tubing. Repeat  3 times. Complete this exercise 2 times per day.   STRENGTH - Plantar-flexors  Stand with your feet shoulder width apart. Steady yourself with a wall or table using as little support as needed. Keeping your weight evenly spread over the width of your feet, rise up on your toes.* Hold this position for 10 seconds. Repeat 3 times. Complete this exercise 2 times per day.  *If this is too easy, shift your weight toward your right / left leg until you feel challenged. Ultimately, you may be asked to do this exercise with your right / left foot only.  STRENGTH  Plantar-flexors, Eccentric  Note: This exercise can place a lot of stress on your foot and ankle. Please complete this exercise only if specifically instructed by your caregiver.  Place the balls of your feet on a step. With your hands, use only enough support from a wall or rail to keep your balance. Keep your knees straight and rise up on your toes. Slowly shift your weight entirely to your right / left toes and pick up your opposite foot. Gently and with controlled movement, lower your weight through your right / left foot so that your heel drops below the level of the step. You will feel a slight stretch in the back of your calf at the end position. Use the healthy leg to help rise up onto the balls of both feet, then lower weight only on the right / left leg again. Build up to 15 repetitions. Then progress to 3 consecutive sets of 15 repetitions.* After completing the above exercise, complete the same exercise with a slight knee bend (about 30 degrees). Again, build up to 15 repetitions. Then progress to 3 consecutive sets of 15 repetitions.* Perform this exercise 2 times per day.  *When you easily complete 3 sets of 15, your physician, physical therapist or athletic trainer may advise you to add resistance by wearing a backpack filled with additional weight.  STRENGTH - Plantar Flexors, Seated  Sit on a chair that allows your feet to rest  flat on the ground. If necessary, sit at the edge of the chair. Keeping your toes firmly on the ground, lift your right / left heel as far as you can without increasing any discomfort in your ankle. Repeat 3 times. Complete this exercise 2 times a day.

## 2022-07-10 NOTE — Progress Notes (Signed)
  Subjective:  Patient ID: [name], female    DOB: 06-Jan-1981,  MRN: [id number]  Chief Complaint  Patient presents with   Foot Pain    np left heel pain for months not getting better    42 y.o. female presents with the above complaint. History confirmed with patient.  Most of the pain is in the back and heel.  Left is worse than right  Objective:  Physical Exam: warm, good capillary refill, no trophic changes or ulcerative lesions, normal DP and PT pulses, and normal sensory exam. Left worse than right pain to palpation to posterior heel and retrocalcaneal space.  Plantar callus heel  Radiographs: Multiple views x-ray of the both feet show Haglund deformity Assessment:   1. Haglund's deformity of both heels   2. Achilles bursitis of both lower extremities      Plan:  Patient was evaluated and treated and all questions answered.  Discussed the etiology and treatment options for Achilles tendinitis, bursitis and Haglund deformity including stretching, formal physical therapy with an eccentric exercises therapy plan, supportive shoegears such as a running shoe or sneaker, heel lifts, topical and oral medications.  We also discussed that I do not routinely perform injections in this area because of the risk of an increased damage or rupture of the tendon.  We also discussed the role of surgical treatment of this for patients who do not improve after exhausting non-surgical treatment options.  We discussed an injection in the bursa could be completed as well but she would not be immobilized in a boot if we did this.  -XR reviewed with patient -Educated on stretching and icing of the affected limb. -Rx for Medrol 6-day taper. Advised on risks, benefits, and alternatives of the medication -Rx for Medrol and diclofenac p.o. Advised to start this the day after completion of oral steroid taper.  Also recommended topical diclofenac  Recommended using urea cream for the callus  Return  if symptoms worsen or fail to improve.

## 2022-09-10 ENCOUNTER — Other Ambulatory Visit: Payer: Self-pay | Admitting: Podiatry

## 2023-09-18 ENCOUNTER — Encounter: Payer: Self-pay | Admitting: Allergy and Immunology

## 2023-09-18 ENCOUNTER — Ambulatory Visit: Admitting: Allergy and Immunology

## 2023-09-18 ENCOUNTER — Other Ambulatory Visit: Payer: Self-pay

## 2023-09-18 VITALS — BP 130/88 | HR 80 | Temp 97.7°F | Resp 16 | Ht 64.76 in | Wt 202.8 lb

## 2023-09-18 DIAGNOSIS — G4489 Other headache syndrome: Secondary | ICD-10-CM | POA: Diagnosis not present

## 2023-09-18 DIAGNOSIS — J3089 Other allergic rhinitis: Secondary | ICD-10-CM | POA: Diagnosis not present

## 2023-09-18 DIAGNOSIS — I10 Essential (primary) hypertension: Secondary | ICD-10-CM | POA: Diagnosis not present

## 2023-09-18 DIAGNOSIS — J301 Allergic rhinitis due to pollen: Secondary | ICD-10-CM | POA: Diagnosis not present

## 2023-09-18 MED ORDER — FLUTICASONE PROPIONATE 50 MCG/ACT NA SUSP: 2.0000 | Freq: Every day | NASAL | 2 refills | Status: AC

## 2023-09-18 MED ORDER — LORATADINE 10 MG PO TABS: 10.0000 mg | ORAL_TABLET | Freq: Every day | ORAL | 2 refills | Status: AC | PRN

## 2023-09-18 NOTE — Patient Instructions (Signed)
  1. Use the following for allergies:  A. Loratadine 10 mg - 1 tablet 1 time per day B. Flonase 1-2 sprays each nostril 3-7 times per week   2. Contact clinic if headaches return  3. Monitor blood pressure  4. Influenza = Tamiflu. Covid = Paxlovid  5. Further evaluation???

## 2023-09-18 NOTE — Progress Notes (Unsigned)
 [place] - [place] - [place]   Dear [name],  Thank you for referring [name] to the [facility] [place] on 09/18/2023.   Below is a summation of this patient's evaluation and recommendations.  Thank you for your referral. I will keep you informed about this patient's response to treatment.   If you have any questions please do not hesitate to contact me.   Sincerely,  Deshonda Priest, MD Allergy / Immunology [facility]   ______________________________________________________________________    NEW PATIENT NOTE  Referring Provider: Farris Has, MD Primary Provider: Farris Has, MD Date of office visit: 09/18/2023    Subjective:   Chief Complaint:  [name] (DOB: 04/01/1981) is a 43 y.o. female who presents to the clinic on 09/18/2023 with a chief complaint of Allergic Rhinitis  (Says she suffers from sinus pressure and has been dealing with it for the past few months. ) .     HPI: [name] is at this clinic in evaluation of headache.  Apparently I had seen [name] in this clinic 5 years ago for allergic rhinoconjunctivitis.  She does have some occasional sneezing and there may be some seasonal variation with exposure to pollens but usually when she takes an antihistamine and sometimes a nasal steroid it appears to help this issue.  What is new for [name] is the fact that she has developed a headache syndrome sometime in 2024.  She describes acute onset of a pounding headache affecting her entire head unassociated with any visual changes or nausea that last about 30 seconds and during this timeframe she finds it hard to function.  She did have 1 episode of dizziness several weeks ago in association with this pounding headache.  She does have to shut her eyes and let it pass.  There is not really an obvious provoking factor giving rise to this issue.   Sometimes it will occur when she stands up and sometimes it occurs if she turns her head fasten sometimes there is no reason for why it occurs.  Her frequency was almost daily and sometimes a few times per day but fortunately over the course of the past 2 weeks she has not had any headaches.  She did have a Lupron injection in May 2024 and August 2024 but otherwise does not really use any medications or take any supplements.  She does note that occasionally her blood pressure is high when she goes to the doctor.  Is been as high as 140 systolic.  She does not check her blood pressure at home.  Past Medical History:  Diagnosis Date   Anemia    due to fibroids per pt   Fibroids    uterine   Urticaria     Past Surgical History:  Procedure Laterality Date   HYSTEROSCOPY WITH D & C N/A 10/27/2020   Procedure: DILATATION & CURETTAGE/HYSTEROSCOPY;  Surgeon: Gerald Leitz, MD;  Location: [facility];  Service: Gynecology;  Laterality: N/A;   MYOMECTOMY N/A 07/28/2021   Procedure: ABDOMINAL MYOMECTOMY;  Surgeon: Olivia Mackie, MD;  Location: [facility];  Service: Gynecology;  Laterality: N/A;   ROBOT ASSISTED MYOMECTOMY  2009   ROBOT ASSISTED MYOMECTOMY N/A 02/20/2014   Procedure: ROBOTIC ASSISTED MYOMECTOMY;  Surgeon: Fermin Schwab, MD;  Location: [facility];  Service: Gynecology;  Laterality: N/A;   ROBOTIC ASSISTED LAPAROSCOPIC LYSIS OF ADHESION N/A 02/20/2014  Procedure: ROBOTIC ASSISTED LAPAROSCOPIC LYSIS OF ADHESION,EXCISION OF ENDOMETRIOSIS;  Surgeon: Fermin Schwab, MD;  Location: [facility];  Service: Gynecology;  Laterality: N/A;   ROBOTIC ASSISTED LAPAROSCOPIC OVARIAN CYSTECTOMY Right 02/20/2014   Procedure: ROBOTIC ASSISTED LAPAROSCOPIC OVARIAN CYSTECTOMY;  Surgeon: Fermin Schwab, MD;  Location: [facility];  Service: Gynecology;  Laterality: Right;    Allergies as of 09/18/2023       Reactions   Bee Pollen Itching   Pollen, grass, etc. / environmental  allergy causes sneezing.   Dust Mite Extract Cough   Grass Pollen([id number] Vern) Itching   Pollen Extract Itching   Pollen, grass, etc. / environmental allergy causes sneezing.        Medication List    ALAVERT D-12 HOUR ALLERGY/CONG PO Take 1 tablet by mouth every morning.   Cyanocobalamin 2500 MCG Subl Place 2,500 mcg under the tongue daily.        Review of systems negative except as noted in HPI / PMHx or noted below:  Review of Systems  Constitutional: Negative.   HENT: Negative.    Eyes: Negative.   Respiratory: Negative.    Cardiovascular: Negative.   Gastrointestinal: Negative.   Genitourinary: Negative.   Musculoskeletal: Negative.   Skin: Negative.   Neurological: Negative.   Endo/Heme/Allergies: Negative.   Psychiatric/Behavioral: Negative.      Family History  Problem Relation Age of Onset   Asthma Mother     Social History   Socioeconomic History   Marital status: Single    Spouse name: Not on file   Number of children: Not on file   Years of education: Not on file   Highest education level: Not on file  Occupational History   Not on file  Tobacco Use   Smoking status: Never    Passive exposure: Never   Smokeless tobacco: Never  Vaping Use   Vaping status: Never Used  Substance and Sexual Activity   Alcohol use: Yes    Comment: very rare   Drug use: Never   Sexual activity: Never  Other Topics Concern   Not on file  Social History Narrative   Not on file   Environmental and Social history  Lives in a apartment with a dry environment, no animals looking inside the household, carpet in the bedroom, no plastic on the bed, no plastic on the pillow, no smoking ongoing with inside the household.  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  Objective:   Vitals:   09/18/23 0934  BP: 130/88  Pulse: 80  Resp: 16  Temp: 97.7 F (36.5 C)  SpO2: 98%   Height: 5' 4.76" (164.5 cm) Weight: 202 lb 12.8 oz (92  kg)  Physical Exam Constitutional:      Appearance: She is not diaphoretic.  HENT:     Head: Normocephalic.     Right Ear: Tympanic membrane, ear canal and external ear normal.     Left Ear: Tympanic membrane, ear canal and external ear normal.     Nose: Nose normal. No mucosal edema or rhinorrhea.     Mouth/Throat:     Pharynx: Uvula midline. No oropharyngeal exudate.  Eyes:     Conjunctiva/sclera: Conjunctivae normal.  Neck:     Thyroid: No thyromegaly.     Trachea: Trachea normal. No tracheal tenderness or tracheal deviation.  Cardiovascular:     Rate and Rhythm: Normal rate and regular rhythm.     Heart sounds: Normal heart sounds, S1 normal and S2 normal. No  murmur heard. Pulmonary:     Effort: No respiratory distress.     Breath sounds: Normal breath sounds. No stridor. No wheezing or rales.  Lymphadenopathy:     Head:     Right side of head: No tonsillar adenopathy.     Left side of head: No tonsillar adenopathy.     Cervical: No cervical adenopathy.  Skin:    Findings: No erythema or rash.     Nails: There is no clubbing.  Neurological:     Mental Status: She is alert.     Diagnostics: Allergy skin tests were performed.   Spirometry was performed and demonstrated an FEV1 of *** @ *** % of predicted. FEV1/FVC = ***  The patient had an Asthma Control Test with the following results:  .     Assessment and Plan:    No diagnosis found.  Patient Instructions   1. Use the following for allergies:  A. Loratadine 10 mg - 1 tablet 1 time per day B. Flonase 1-2 sprays each nostril 3-7 times per week   2. Contact clinic if headaches return  3. Monitor blood pressure  4. Influenza = Tamiflu. Covid = Paxlovid  5. Further evaluation???   Lenny Priest, MD Allergy / Immunology [facility]

## 2023-09-19 ENCOUNTER — Encounter: Payer: Self-pay | Admitting: Allergy and Immunology

## 2023-09-27 ENCOUNTER — Telehealth: Payer: Self-pay

## 2023-09-27 DIAGNOSIS — G4489 Other headache syndrome: Secondary | ICD-10-CM

## 2023-09-27 NOTE — Telephone Encounter (Signed)
 [name]  P [facility] Admin Pool (supporting Amy Priest, MD)16 minutes ago (4:24 PM)    Sounds good thank you!    You  Mee Hives hours ago (1:44 PM)    Hello [name],   Dr. Lucie Arias received your message and he would like to set you up for a Brain MRI with contrast and also refer you to [facility].  We can set up the MRI at [facility]) and will send that referral for you.  Please let me know if this plan works for you and I will proceed with setting up MRI and referral. Thank you, [name], CMA    Kozlow, Amy Philips, MD  [facility]  Clinical6 hours ago (9:48 AM)    Please schedule MRI brain w contrast and referral to Neurology for migraine headache     [name], [name] routed conversation to [name], [name], MD8 hours ago (8:11 AM)   [name]  P [facility] Admin Pool (supporting Amy Priest, MD)Yesterday (4:26 PM)    Can you let Dr. Lucie Arias know that the pressure in my head has come back? It's been three days now, but it is not as intense as it usually is. It's more in my ear area on both sides.

## 2023-09-27 NOTE — Telephone Encounter (Signed)
 Sent referral/order for Neurology referral and Brain MRI. Note: No prior authorization needed per [facility] website for [id number]

## 2023-11-02 ENCOUNTER — Ambulatory Visit
Admission: RE | Admit: 2023-11-02 | Discharge: 2023-11-02 | Disposition: A | Discharge status: Home / Self Care | Source: Ambulatory Visit | Attending: Allergy and Immunology | Admitting: Allergy and Immunology

## 2023-11-02 MED ORDER — GADOPICLENOL 0.5 MMOL/ML IV SOLN
8.0000 mL | Freq: Once | INTRAVENOUS | Status: AC | PRN
  Administered 2023-11-02: 8 mL via INTRAVENOUS

## 2024-01-21 ENCOUNTER — Ambulatory Visit: Admitting: Neurology

## 2024-01-21 ENCOUNTER — Encounter: Payer: Self-pay | Admitting: Neurology

## 2024-01-21 VITALS — BP 128/72 | Ht 65.0 in | Wt 200.0 lb

## 2024-01-21 DIAGNOSIS — G43709 Chronic migraine without aura, not intractable, without status migrainosus: Secondary | ICD-10-CM | POA: Insufficient documentation

## 2024-01-21 MED ORDER — SUMATRIPTAN SUCCINATE 50 MG PO TABS: 50.0000 mg | ORAL_TABLET | ORAL | 11 refills | Status: AC | PRN

## 2024-01-21 NOTE — Progress Notes (Signed)
 Chief Complaint  Patient presents with   New Patient (Initial Visit)    Rm 14, NP for migraines. alone      ASSESSMENT AND PLAN  [name] is a 43 y.o. female   Frequent head pressure,  Likely migraine variant  Imitrex  as needed  Magnesium oxide, plus riboflavin 100 mg twice a day as migraine prevention  Return to clinic for worsening symptoms  DIAGNOSTIC DATA (LABS, IMAGING, TESTING) - I reviewed patient records, labs, notes, testing and imaging myself where available.   MEDICAL HISTORY:  [name], is a 43 year old female, seen in request by her primary care from [facility]   Dr. Kip Righter, for evaluation of head pressure, initial evaluation January 21, 2024    History is obtained from the patient and review of electronic medical records. I personally reviewed pertinent available imaging films in PACS.   PMHx of  Allergy  Iron  Deficiency Anemia  She had occasional migraine headache in the past, typical migraine is severe pounding headache with light, noise sensitivity, nauseous, has to wear dark glasses lying in dark room,  Since 2024, she began to have different kind of headache, described pressure behind her face, as if her head going to explode, happen frequently, sometimes lasting for couple days, but denies significant light noise sensitivity  Personally reviewed MRI of the brain with without contrast from May 2025, mild small vessel disease no acute abnormality, no significant sinus disease  PHYSICAL EXAM:   Vitals:   01/21/24 1528  BP: 128/72  Weight: 200 lb (90.7 kg)  Height: 5' 5 (1.651 m)     Body mass index is 33.28 kg/m.  PHYSICAL EXAMNIATION:  Gen: NAD, conversant, well nourised, well groomed                     Cardiovascular: Regular rate rhythm, no peripheral edema, warm, nontender. Eyes: Conjunctivae clear without exudates or hemorrhage Neck: Supple, no carotid bruits. Pulmonary: Clear to auscultation bilaterally   NEUROLOGICAL  EXAM:  MENTAL STATUS: Speech/cognition: Awake, alert, oriented to history taking and casual conversation CRANIAL NERVES: CN II: Visual fields are full to confrontation. Pupils are round equal and briskly reactive to light.  Funduscopy examination were normal bilaterally CN III, IV, VI: extraocular movement are normal. No ptosis. CN V: Facial sensation is intact to light touch CN VII: Face is symmetric with normal eye closure  CN VIII: Hearing is normal to causal conversation. CN IX, X: Phonation is normal. CN XI: Head turning and shoulder shrug are intact  MOTOR: There is no pronator drift of out-stretched arms. Muscle bulk and tone are normal. Muscle strength is normal.  REFLEXES: Reflexes are 2+ and symmetric at the biceps, triceps, knees, and ankles. Plantar responses are flexor.  SENSORY: Intact to light touch, pinprick and vibratory sensation are intact in fingers and toes.  COORDINATION: There is no trunk or limb dysmetria noted.  GAIT/STANCE: Posture is normal. Gait is steady    REVIEW OF SYSTEMS:  Full 14 system review of systems performed and notable only for as above All other review of systems were negative.   ALLERGIES: Allergies  Allergen Reactions   Bee Pollen Itching    Pollen, grass, etc. / environmental allergy  causes sneezing.   Dust Mite Extract Cough   Grass Pollen(K-O-R-T-Swt Vern) Itching   Pollen Extract Itching    Pollen, grass, etc. / environmental allergy  causes sneezing.    HOME MEDICATIONS: Current Outpatient Medications  Medication Sig Dispense Refill  fluticasone  (FLONASE ) 50 MCG/ACT nasal spray Place 2 sprays into both nostrils daily. 48 g 2   loratadine  (CLARITIN ) 10 MG tablet Take 1 tablet (10 mg total) by mouth daily as needed for allergies (Can take an exrea dose during flare ups.). 180 tablet 2   Multiple Vitamins-Iron  (ONE-TABLET-DAILY/IRON  PO) Take 1 tablet by mouth daily.     Cyanocobalamin 2500 MCG SUBL Place 2,500 mcg under  the tongue daily.     Loratadine -Pseudoephedrine (ALAVERT  D-12 HOUR ALLERGY /CONG PO) Take 1 tablet by mouth every morning.     No current facility-administered medications for this visit.    PAST MEDICAL HISTORY: Past Medical History:  Diagnosis Date   Anemia    due to fibroids per pt   Fibroids    uterine   Urticaria     PAST SURGICAL HISTORY: Past Surgical History:  Procedure Laterality Date   HYSTEROSCOPY WITH D & C N/A 10/27/2020   Procedure: DILATATION & CURETTAGE/HYSTEROSCOPY;  Surgeon: Rosalva Sawyer, MD;  Location: [facility];  Service: Gynecology;  Laterality: N/A;   MYOMECTOMY N/A 07/28/2021   Procedure: ABDOMINAL MYOMECTOMY;  Surgeon: Gorge Ade, MD;  Location: [facility];  Service: Gynecology;  Laterality: N/A;   ROBOT ASSISTED MYOMECTOMY  2009   ROBOT ASSISTED MYOMECTOMY N/A 02/20/2014   Procedure: ROBOTIC ASSISTED MYOMECTOMY;  Surgeon: Cynthia Loss, MD;  Location: [facility];  Service: Gynecology;  Laterality: N/A;   ROBOTIC ASSISTED LAPAROSCOPIC LYSIS OF ADHESION N/A 02/20/2014   Procedure: ROBOTIC ASSISTED LAPAROSCOPIC LYSIS OF ADHESION,EXCISION OF ENDOMETRIOSIS;  Surgeon: Cynthia Loss, MD;  Location: [facility];  Service: Gynecology;  Laterality: N/A;   ROBOTIC ASSISTED LAPAROSCOPIC OVARIAN CYSTECTOMY Right 02/20/2014   Procedure: ROBOTIC ASSISTED LAPAROSCOPIC OVARIAN CYSTECTOMY;  Surgeon: Cynthia Loss, MD;  Location: [facility];  Service: Gynecology;  Laterality: Right;    FAMILY HISTORY: Family History  Problem Relation Age of Onset   Asthma Mother     SOCIAL HISTORY: Social History   Socioeconomic History   Marital status: Single    Spouse name: Not on file   Number of children: Not on file   Years of education: Not on file   Highest education level: Not on file  Occupational History   Not on file  Tobacco Use   Smoking status: Never    Passive exposure: Never   Smokeless tobacco: Never  Vaping Use   Vaping  status: Never Used  Substance and Sexual Activity   Alcohol use: Yes    Comment: very rare   Drug use: Never   Sexual activity: Never  Other Topics Concern   Not on file  Social History Narrative   Right handed   Caffeine- none   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 Ph.D.  [facility] [address] [place], [place] [address] Ph: [phone] Fax: [phone]  CC:  Kip Righter, MD [address] [place],  [place] [address]  Kip Righter, MD

## 2024-07-08 ENCOUNTER — Ambulatory Visit: Admitting: Family

## 2024-07-08 ENCOUNTER — Encounter: Payer: Self-pay | Admitting: Family

## 2024-07-08 ENCOUNTER — Other Ambulatory Visit: Payer: Self-pay

## 2024-07-08 VITALS — BP 130/80 | HR 92 | Temp 97.8°F

## 2024-07-08 DIAGNOSIS — J3089 Other allergic rhinitis: Secondary | ICD-10-CM | POA: Diagnosis not present

## 2024-07-08 DIAGNOSIS — J019 Acute sinusitis, unspecified: Secondary | ICD-10-CM | POA: Diagnosis not present

## 2024-07-08 DIAGNOSIS — J301 Allergic rhinitis due to pollen: Secondary | ICD-10-CM | POA: Diagnosis not present

## 2024-07-08 MED ORDER — AMOXICILLIN-POT CLAVULANATE 875-125 MG PO TABS: 1.0000 | ORAL_TABLET | Freq: Two times a day (BID) | ORAL | 0 refills | Status: AC

## 2024-07-08 NOTE — Progress Notes (Signed)
 "  [address] [place] [address] Dept: [phone]  FOLLOW UP NOTE  Patient ID: [name], female    DOB: May 08, 1981  Age: 44 y.o. MRN: [id number] Date of Office Visit: 07/08/2024  Assessment  Chief Complaint: Sinus Problem (Issues x 1 week states having pressure in both ears loss of voice hoarseness ), Nasal Congestion, and Cough  HPI [name] is a 44 year old female who presents today for acute visit of possible sinus infection.  She was last seen on September 18, 2023 by Dr. Kozlow for headache syndrome, perennial allergic rhinitis, seasonal allergic rhinitis due to pollen, and primary hypertension.  She denies any new diagnosis or surgery since her last office visit.  She reports two Sundays ago as she was getting closer to [facility] she got weak and sleepy.  [place]  On Monday she felt fine.  On Tuesday she had voice changes.  On Wednesday she felt like something was in her throat that she describes as swelling like you are about to get sick.  She has had sinus pressure constantly around her nose and both ears.  She has had postnasal drip that is yellow in color.  She has not really had nasal congestion.  Her rhinorrhea started this morning.  She denies fever, chills, sick contacts, and bodyaches.  She also reports that she is coughing when she lays down.  Her body is also sore from coughing.  She denies any history of asthma.  She has tried taking her medication for migraines by her neurologist and that helps some.  She reports that she always has pressure on both sides of her ears.  She has not been treated for any sinus infections since we last saw her, but reports that she always feels like she has a sinus infection.  Skin testing from November 2021 was positive to grass pollen, weed pollen, tree pollen and dust mite.   Drug Allergies:  Allergies[1]  Review of Systems: Negative except as per HPI  Physical Exam: BP 130/80    Pulse 92   Temp 97.8 F (36.6 C)   SpO2 99%    Physical Exam Constitutional:      Appearance: Normal appearance.  HENT:     Head: Normocephalic and atraumatic.     Comments: Pharynx normal, eyes normal, ears normal, nose: Bilateral lower turbinates moderately edematous and slightly erythematous with clear drainage and    Right Ear: Tympanic membrane, ear canal and external ear normal.     Left Ear: Tympanic membrane, ear canal and external ear normal.     Mouth/Throat:     Mouth: Mucous membranes are moist.     Pharynx: Oropharynx is clear.  Eyes:     Conjunctiva/sclera: Conjunctivae normal.  Cardiovascular:     Rate and Rhythm: Regular rhythm.     Heart sounds: Normal heart sounds.  Pulmonary:     Effort: Pulmonary effort is normal.     Breath sounds: Normal breath sounds.     Comments: Lungs clear to auscultation Musculoskeletal:     Cervical back: Neck supple.  Skin:    General: Skin is warm.  Neurological:     Mental Status: She is alert and oriented to person, place, and time.  Psychiatric:        Mood and Affect: Mood normal.        Behavior: Behavior normal.        Thought Content: Thought content normal.  Judgment: Judgment normal.     Diagnostics:  None  Assessment and Plan: 1. Acute non-recurrent sinusitis, unspecified location   2. Perennial allergic rhinitis   3. Seasonal allergic rhinitis due to pollen     Meds ordered this encounter  Medications   amoxicillin -clavulanate (AUGMENTIN ) 875-125 MG tablet    Sig: Take 1 tablet by mouth 2 (two) times daily.    Dispense:  14 tablet    Refill:  0    Patient Instructions   1. Use the following for allergies:  A. Loratadine  10 mg - 1 tablet 1 time per day B. Flonase  1-2 sprays each nostril 3-7 times per week   2. Continue to follow up with Neurology for migraines  3. Start Augmentin  875 mg taking 1 tablet twice a day for  7 days  4. Influenza = Tamiflu. Covid = Paxlovid  5. Further  evaluation???  Follow up in 2-3 months with Dr. Maurilio  Return in about 2 months (around 09/05/2024).    Thank you for the opportunity to care for this patient.  Please do not hesitate to contact me with questions.  Wanda Craze, FNP [facility]         [1]  Allergies Allergen Reactions   Bee Pollen Itching    Pollen, grass, etc. / environmental allergy  causes sneezing.   Dust Mite Extract Cough   Grass Pollen(K-O-R-T-Swt Vern) Itching   Pollen Extract Itching    Pollen, grass, etc. / environmental allergy  causes sneezing.   "

## 2024-07-08 NOTE — Patient Instructions (Addendum)
" °  1. Use the following for allergies:  A. Loratadine  10 mg - 1 tablet 1 time per day B. Flonase  1-2 sprays each nostril 3-7 times per week   2. Continue to follow up with Neurology for migraines  3. Start Augmentin  875 mg taking 1 tablet twice a day for  7 days  4. Influenza = Tamiflu. Covid = Paxlovid  5. Further evaluation???  Follow up in 2-3 months with Dr. Kozlow "
